# Patient Record
Sex: Female | Born: 1975 | Race: Black or African American | Hispanic: No | Marital: Married | Smoking: Former smoker
Health system: Southern US, Community
[De-identification: ages and names within clinical notes are randomized; demographics above are authoritative.]

## PROBLEM LIST (undated history)

## (undated) DIAGNOSIS — Z789 Other specified health status: Secondary | ICD-10-CM

## (undated) DIAGNOSIS — M549 Dorsalgia, unspecified: Secondary | ICD-10-CM

## (undated) HISTORY — PX: NO PAST SURGERIES: SHX2092

---

## 2001-10-24 ENCOUNTER — Emergency Department (HOSPITAL_COMMUNITY): Admission: EM | Admit: 2001-10-24 | Discharge: 2001-10-24 | Payer: Self-pay

## 2015-07-03 ENCOUNTER — Other Ambulatory Visit: Payer: Self-pay | Admitting: Family Medicine

## 2015-11-17 ENCOUNTER — Ambulatory Visit
Admission: EM | Admit: 2015-11-17 | Discharge: 2015-11-17 | Disposition: A | Discharge status: Home / Self Care | Payer: BLUE CROSS/BLUE SHIELD | Attending: Family Medicine | Admitting: Family Medicine

## 2015-11-17 ENCOUNTER — Encounter: Payer: Self-pay | Admitting: Emergency Medicine

## 2015-11-17 DIAGNOSIS — J0100 Acute maxillary sinusitis, unspecified: Secondary | ICD-10-CM

## 2015-11-17 DIAGNOSIS — R1110 Vomiting, unspecified: Secondary | ICD-10-CM

## 2015-11-17 DIAGNOSIS — R197 Diarrhea, unspecified: Secondary | ICD-10-CM

## 2015-11-17 DIAGNOSIS — J40 Bronchitis, not specified as acute or chronic: Secondary | ICD-10-CM | POA: Diagnosis not present

## 2015-11-17 MED ORDER — BENZONATATE 200 MG PO CAPS: 200.0000 mg | ORAL_CAPSULE | Freq: Three times a day (TID) | ORAL | Status: DC | PRN

## 2015-11-17 MED ORDER — ONDANSETRON 8 MG PO TBDP
8.0000 mg | ORAL_TABLET | Freq: Once | ORAL | Status: AC
  Administered 2015-11-17: 8 mg via ORAL

## 2015-11-17 MED ORDER — AMOXICILLIN-POT CLAVULANATE 875-125 MG PO TABS: 1.0000 | ORAL_TABLET | Freq: Two times a day (BID) | ORAL | Status: DC

## 2015-11-17 MED ORDER — ONDANSETRON 4 MG PO TBDP: 4.0000 mg | ORAL_TABLET | Freq: Three times a day (TID) | ORAL | Status: DC | PRN

## 2015-11-17 NOTE — ED Provider Notes (Signed)
[facility]  ____________________________________________  Time seen: Approximately 5:48 PM  I have reviewed the triage vital signs and the nursing notes.   HISTORY  Chief Complaint Cough and Diarrhea  HPI Julia Savage is a 40 y.o. female presents with spouse at bedside for the complaints of 5-6 days of runny nose, nasal congestion, sinus drainage, cough and chest congestion. Also reports 2 days of intermittent diarrhea. Patient states that she thinks that she has had 6 episodes of diarrhea since yesterday. States diarrhea is normal color. Denies any blood in stool, dark-colored stool, blood in toilet or when wiping. Patient also reports over the last 24 hours she has had 2 or 3 episodes of vomiting. Patient states last episode of vomiting was early this morning. Patient states that she has only vomited after coughing several times back-to-back. Patient denies any vomiting in absence of actively coughing. States mild nausea at this time and can feel drainage in back of throat. Patient also states that she feels like she pulled a muscle in her left side abdomen from coughing. States that this feels like a pulled a muscle. Denies abdominal pain.   Reports multiple sick contacts at work that have had GI symptoms as well as the flu. Patient states that her biggest complaint is the cough and states that the cough is a hacking cough and did wake her up several times last night. Patient reports that she did blow her nose earlier today and saw a mild amount of pink blood that was present with mucous. Denies any other bleeding. Denies any other abnormal bleeding. Denies any blood clots. Denies any nosebleeds in absence of actively blowing her nose. Patient reports that she has that in the past when coughing and blowing her nose a lot. Denies blood in vomit.  Denies fevers. Denies chest pain, shortness of breath, chest pain with deep breath, extremity pain, extremity swelling, rash, neck pain,  back pain, dysuria, vaginal complaints.   Patient's last menstrual period was 11/03/2015 (approximate). Denies chance of pregnancy.   History reviewed. No pertinent past medical history. Chronic low back pain.    There are no active problems to display for this patient.   History reviewed. No pertinent past surgical history.  Current Outpatient Rx  Name  Route  Sig  Dispense  Refill  . gabapentin (NEURONTIN) 600 MG tablet   Oral   Take 600 mg by mouth 3 (three) times daily.           Allergies Hydrocodone; Percocet; and Vicodin  History reviewed. No pertinent family history.  Social History Social History  Substance Use Topics  . Smoking status: Current Every Day Smoker -- 1.00 packs/day    Types: Cigarettes  . Smokeless tobacco: None  . Alcohol Use: Yes    Review of Systems Constitutional: No fever/chills Eyes: No visual changes. ENT: No sore throat. Positive runny nose, nasal congestion, cough, and sinus pressure.  Cardiovascular: Denies chest pain. Respiratory: Denies shortness of breath. Gastrointestinal: No abdominal pain.  No constipation. Genitourinary: Negative for dysuria. Musculoskeletal: Negative for back pain. Skin: Negative for rash. Neurological: Negative for headaches, focal weakness or numbness.  10-point ROS otherwise negative.  ____________________________________________   PHYSICAL EXAM:  VITAL SIGNS: ED Triage Vitals  Enc Vitals Group     BP 11/17/15 1655 119/74 mmHg     Pulse Rate 11/17/15 1655 78     Resp 11/17/15 1655 17     Temp 11/17/15 1655 98.2 F (36.8 C)  Temp Source 11/17/15 1655 Oral     SpO2 11/17/15 1655 100 %     Weight 11/17/15 1655 284 lb (128.822 kg)     Height 11/17/15 1655 5\' 8"  (1.727 m)     Head Cir --      Peak Flow --      Pain Score 11/17/15 1659 4     Pain Loc --      Pain Edu? --      Excl. in GC? --   Constitutional: Alert and oriented. Well appearing and in no acute distress. Eyes:  Conjunctivae are normal. PERRL. EOMI. Head: Atraumatic.Mild tenderness to palpation bilateral frontal and maxillary sinuses. No swelling. No erythema.   Ears: no erythema, dullness bilaterally, otherwise normal TMs bilaterally.   Nose: nasal congestion with bilateral nasal turbinate erythema and edema. No active bleeding or dried blood present.   Mouth/Throat: Mucous membranes are moist.  Oropharynx non-erythematous.No tonsillar swelling or exudate.  Neck: No stridor.  No cervical spine tenderness to palpation. Hematological/Lymphatic/Immunilogical: No cervical lymphadenopathy. Cardiovascular: Normal rate, regular rhythm. Grossly normal heart sounds.  Good peripheral circulation. Chest nontender to palpation.   Respiratory: Normal respiratory effort.  No retractions. Lungs CTAB. No wheezes, rales or rhonchi. Good air movement.  Gastrointestinal: Soft and nontender. Obese abdomen. Normal Bowel sounds. No CVA tenderness. Musculoskeletal: No lower or upper extremity tenderness nor edema.  Bilateral pedal pulses equal and easily palpated. No cervical, thoracic or lumbar tenderness to palpation. No calf tenderness bilaterally.  Neurologic:  Normal speech and language. No gross focal neurologic deficits are appreciated. No gait instability. Skin:  Skin is warm, dry and intact. No rash noted. Psychiatric: Mood and affect are normal. Speech and behavior are normal.  ____________________________________________   LABS (all labs ordered are listed, but only abnormal results are displayed)  Labs Reviewed - No data to display   INITIAL IMPRESSION / ASSESSMENT AND PLAN / ED COURSE  Pertinent labs & imaging results that were available during my care of the patient were reviewed by me and considered in my medical decision making (see chart for details).  Well appearing patient. No acute distress.  Presents for the complaints of 5-6 days of runny nose, nasal congestion and cough with chest congestion.  Spouse states cough keeps her up at night.  Patient reports the last 2 days intermittent diarrhea as well as some vomiting. Patient reports that vomiting is only present when coughing back-to-back several times causing her to gag and then vomited. Patient states that she feels like she is pulled muscle in her left abdomen. Abdomen soft and nontender to palpation. Lungs clear throughout. Patient denies abdominal pain at this time.  reports multiple sick contacts at work including influenza as well as GI symptoms. Suspect sinusitis and bronchitis with gastroenteritis suspect viral. Will treat patient with oral Augmentin, and when necessary Tessalon Perles, and when necessary Zofran.Encouraged supportive treatment including rest, fluids, over-the-counter Tylenol or ibuprofen as needed. BRAT diet. Encouraged close the follow-up. Discussed indication, risks and benefits of medications with patient.  Discussed follow up with Primary care physician this week. Discussed follow up and return parameters to Urgent care or ER including fevers, abdominal pain, abnormal bleeding, weakness, chest pain, shortness of breath, inability to tolerate food or fluids, no resolution or any worsening concerns. Patient verbalized understanding and agreed to plan.   ____________________________________________   FINAL CLINICAL IMPRESSION(S) / ED DIAGNOSES  Final diagnoses:  Acute maxillary sinusitis, recurrence not specified  Bronchitis  Vomiting and diarrhea  Note: This dictation was prepared with Dragon dictation along with smaller phrase technology. Any transcriptional errors that result from this process are unintentional.    Renford Dills, NP 11/17/15 1858

## 2015-11-17 NOTE — ED Notes (Signed)
Patient c/o cough, chest congestion, runny nose, for a week.  Patient denies fevers.

## 2015-11-17 NOTE — Discharge Instructions (Signed)
Take medication as prescribed. Rest. Drink plenty of fluids. Take over the counter tylenol or ibuprofen as needed.   Follow up with your primary care physician this week.  Return to Urgent care or ER for abdominal pain, inability to tolerate food or fluids, urinary changes, fevers, blood in toilet or stool, blood in vomiting or sputum, new or worsening concerns.    Nausea and Vomiting Nausea means you feel sick to your stomach. Throwing up (vomiting) is a reflex where stomach contents come out of your mouth. HOME CARE   Take medicine as told by your doctor.  Do not force yourself to eat. However, you do need to drink fluids.  If you feel like eating, eat a normal diet as told by your doctor.  Eat rice, wheat, potatoes, bread, lean meats, yogurt, fruits, and vegetables.  Avoid high-fat foods.  Drink enough fluids to keep your pee (urine) clear or pale yellow.  Ask your doctor how to replace body fluid losses (rehydrate). Signs of body fluid loss (dehydration) include:  Feeling very thirsty.  Dry lips and mouth.  Feeling dizzy.  Dark pee.  Peeing less than normal.  Feeling confused.  Fast breathing or heart rate. GET HELP RIGHT AWAY IF:   You have blood in your throw up.  You have black or bloody poop (stool).  You have a bad headache or stiff neck.  You feel confused.  You have bad belly (abdominal) pain.  You have chest pain or trouble breathing.  You do not pee at least once every 8 hours.  You have cold, clammy skin.  You keep throwing up after 24 to 48 hours.  You have a fever. MAKE SURE YOU:   Understand these instructions.  Will watch your condition.  Will get help right away if you are not doing well or get worse.   This information is not intended to replace advice given to you by your health care provider. Make sure you discuss any questions you have with your health care provider.   Document Released: 02/01/2008 Document Revised:  11/07/2011 Document Reviewed: 01/14/2011 Elsevier Interactive Patient Education 2016 Else[employer] Inc[employer]  Diarrhea Diarrhea is watery poop (stool). It can make you feel weak, tired, thirsty, or give you a dry mouth (signs of dehydration). Watery poop is a sign of another problem, most often an infection. It often lasts 2-3 days. It can last longer if it is a sign of something serious. Take care of yourself as told by your doctor. HOME CARE   Drink 1 cup (8 ounces) of fluid each time you have watery poop.  Do not drink the following fluids:  Those that contain simple sugars (fructose, glucose, galactose, lactose, sucrose, maltose).  Sports drinks.  Fruit juices.  Whole milk products.  Sodas.  Drinks with caffeine (coffee, tea, soda) or alcohol.  Oral rehydration solution may be used if the doctor says it is okay. You may make your own solution. Follow this recipe:   - teaspoon table salt.   teaspoon baking soda.   teaspoon salt substitute containing potassium chloride.  1 tablespoons sugar.  1 liter (34 ounces) of water.  Avoid the following foods:  High fiber foods, such as raw fruits and vegetables.  Nuts, seeds, and whole grain breads and cereals.   Those that are sweetened with sugar alcohols (xylitol, sorbitol, mannitol).  Try eating the following foods:  Starchy foods, such as rice, toast, pasta, low-sugar cereal, oatmeal, baked potatoes, crackers, and bagels.  Bananas.  Applesauce.  Eat probiotic-rich foods, such as yogurt and milk products that are fermented.  Wash your hands well after each time you have watery poop.  Only take medicine as told by your doctor.  Take a warm bath to help lessen burning or pain from having watery poop. GET HELP RIGHT AWAY IF:   You cannot drink fluids without throwing up (vomiting).  You keep throwing up.  You have blood in your poop, or your poop looks black and tarry.  You do not pee (urinate) in 6-8 hours, or  there is only a small amount of very dark pee.  You have belly (abdominal) pain that gets worse or stays in the same spot (localizes).  You are weak, dizzy, confused, or light-headed.  You have a very bad headache.  Your watery poop gets worse or does not get better.  You have a fever or lasting symptoms for more than 2-3 days.  You have a fever and your symptoms suddenly get worse. MAKE SURE YOU:   Understand these instructions.  Will watch your condition.  Will get help right away if you are not doing well or get worse.   This information is not intended to replace advice given to you by your health care provider. Make sure you discuss any questions you have with your health care provider.   Document Released: 02/01/2008 Document Revised: 09/05/2014 Document Reviewed: 04/22/2012 Elsevier Interactive Patient Education 2016 Elsevier Inc.  Sinusitis, Adult Sinusitis is redness, soreness, and inflammation of the paranasal sinuses. Paranasal sinuses are air pockets within the bones of your face. They are located beneath your eyes, in the middle of your forehead, and above your eyes. In healthy paranasal sinuses, mucus is able to drain out, and air is able to circulate through them by way of your nose. However, when your paranasal sinuses are inflamed, mucus and air can become trapped. This can allow bacteria and other germs to grow and cause infection. Sinusitis can develop quickly and last only a short time (acute) or continue over a long period (chronic). Sinusitis that lasts for more than 12 weeks is considered chronic. CAUSES Causes of sinusitis include:  Allergies.  Structural abnormalities, such as displacement of the cartilage that separates your nostrils (deviated septum), which can decrease the air flow through your nose and sinuses and affect sinus drainage.  Functional abnormalities, such as when the small hairs (cilia) that line your sinuses and help remove mucus do not  work properly or are not present. SIGNS AND SYMPTOMS Symptoms of acute and chronic sinusitis are the same. The primary symptoms are pain and pressure around the affected sinuses. Other symptoms include:  Upper toothache.  Earache.  Headache.  Bad breath.  Decreased sense of smell and taste.  A cough, which worsens when you are lying flat.  Fatigue.  Fever.  Thick drainage from your nose, which often is [place] and may contain pus (purulent).  Swelling and warmth over the affected sinuses. DIAGNOSIS Your health care provider will perform a physical exam. During your exam, your health care provider may perform any of the following to help determine if you have acute sinusitis or chronic sinusitis:  Look in your nose for signs of abnormal growths in your nostrils (nasal polyps).  Tap over the affected sinus to check for signs of infection.  View the inside of your sinuses using an imaging device that has a light attached (endoscope). If your health care provider suspects that you have chronic sinusitis, one or more of  the following tests may be recommended:  Allergy tests.  Nasal culture. A sample of mucus is taken from your nose, sent to a lab, and screened for bacteria.  Nasal cytology. A sample of mucus is taken from your nose and examined by your health care provider to determine if your sinusitis is related to an allergy. TREATMENT Most cases of acute sinusitis are related to a viral infection and will resolve on their own within 10 days. Sometimes, medicines are prescribed to help relieve symptoms of both acute and chronic sinusitis. These may include pain medicines, decongestants, nasal steroid sprays, or saline sprays. However, for sinusitis related to a bacterial infection, your health care provider will prescribe antibiotic medicines. These are medicines that will help kill the bacteria causing the infection. Rarely, sinusitis is caused by a fungal infection. In these  cases, your health care provider will prescribe antifungal medicine. For some cases of chronic sinusitis, surgery is needed. Generally, these are cases in which sinusitis recurs more than 3 times per year, despite other treatments. HOME CARE INSTRUCTIONS  Drink plenty of water. Water helps thin the mucus so your sinuses can drain more easily.  Use a humidifier.  Inhale steam 3-4 times a day (for example, sit in the bathroom with the shower running).  Apply a warm, moist washcloth to your face 3-4 times a day, or as directed by your health care provider.  Use saline nasal sprays to help moisten and clean your sinuses.  Take medicines only as directed by your health care provider.  If you were prescribed either an antibiotic or antifungal medicine, finish it all even if you start to feel better. SEEK IMMEDIATE MEDICAL CARE IF:  You have increasing pain or severe headaches.  You have nausea, vomiting, or drowsiness.  You have swelling around your face.  You have vision problems.  You have a stiff neck.  You have difficulty breathing.   This information is not intended to replace advice given to you by your health care provider. Make sure you discuss any questions you have with your health care provider.   Document Released: 08/15/2005 Document Revised: 09/05/2014 Document Reviewed: 08/30/2011 Elsevier Interactive Patient Education [employer]

## 2017-02-04 ENCOUNTER — Ambulatory Visit (INDEPENDENT_AMBULATORY_CARE_PROVIDER_SITE_OTHER): Payer: BLUE CROSS/BLUE SHIELD

## 2017-02-04 ENCOUNTER — Ambulatory Visit
Admission: EM | Admit: 2017-02-04 | Discharge: 2017-02-04 | Disposition: A | Discharge status: Home / Self Care | Payer: BLUE CROSS/BLUE SHIELD | Attending: Emergency Medicine | Admitting: Emergency Medicine

## 2017-02-04 DIAGNOSIS — J020 Streptococcal pharyngitis: Secondary | ICD-10-CM | POA: Diagnosis not present

## 2017-02-04 DIAGNOSIS — J069 Acute upper respiratory infection, unspecified: Secondary | ICD-10-CM

## 2017-02-04 LAB — RAPID STREP SCREEN (MED CTR MEBANE ONLY): STREPTOCOCCUS, GROUP A SCREEN (DIRECT): POSITIVE — AB

## 2017-02-04 MED ORDER — BENZONATATE 200 MG PO CAPS: ORAL_CAPSULE | ORAL | 0 refills | Status: DC

## 2017-02-04 MED ORDER — ALBUTEROL SULFATE HFA 108 (90 BASE) MCG/ACT IN AERS: 1.0000 | INHALATION_SPRAY | Freq: Four times a day (QID) | RESPIRATORY_TRACT | 0 refills | Status: DC | PRN

## 2017-02-04 MED ORDER — PENICILLIN G BENZATHINE 1200000 UNIT/2ML IM SUSP
1.2000 10*6.[IU] | Freq: Once | INTRAMUSCULAR | Status: AC
  Administered 2017-02-04: 1.2 10*6.[IU] via INTRAMUSCULAR

## 2017-02-04 MED ORDER — AEROCHAMBER PLUS FLO-VU MEDIUM MISC
1.0000 | Freq: Once | Status: AC
  Administered 2017-02-04: 1

## 2017-02-04 NOTE — ED Triage Notes (Signed)
Pt c/o cough, some bloody show with mucus, and sore throat from drainage for about a week.

## 2017-02-04 NOTE — ED Provider Notes (Signed)
CSN: [id number]     Arrival date & time 02/04/17  1321 History   First MD Initiated Contact with Patient 02/04/17 1440     Chief Complaint  Patient presents with  . Cough  . Sore Throat   (Consider location/radiation/quality/duration/timing/severity/associated sxs/prior Treatment) HPI  This a 41 year old female who presents with a cough that she's had for about a week productive of sputum with this several blood specks recently which prompted her visit today. She's also had a sore throat which she thinks is from the drainage and from coughing so much. Patient is a smoker up to 1 pack of cigarettes per day. She has not had fever or chills. He said the cough is loose and causes rattling sensation in her chest.        History reviewed. No pertinent past medical history. History reviewed. No pertinent surgical history. History reviewed. No pertinent family history. Social History  Substance Use Topics  . Smoking status: Current Every Day Smoker    Packs/day: 1.00    Types: Cigarettes  . Smokeless tobacco: Never Used  . Alcohol use Yes   OB History    No data available     Review of Systems  Constitutional: Positive for activity change. Negative for appetite change, chills, fatigue and fever.  HENT: Positive for congestion.   Respiratory: Positive for cough and wheezing. Negative for stridor.   All other systems reviewed and are negative.   Allergies  Hydrocodone; Percocet [oxycodone-acetaminophen]; and Vicodin [hydrocodone-acetaminophen]  Home Medications   Prior to Admission medications   Medication Sig Start Date End Date Taking? Authorizing Provider  dextromethorphan-guaiFENesin (MUCINEX DM) 30-600 MG 12hr tablet Take 1 tablet by mouth 2 (two) times daily.   Yes [provider]  albuterol (PROVENTIL HFA;VENTOLIN HFA) 108 (90 Base) MCG/ACT inhaler Inhale 1-2 puffs into the lungs every 6 (six) hours as needed for wheezing or shortness of breath. 02/04/17   Lutricia Feil, PA-C  benzonatate (TESSALON) 200 MG capsule Take one cap TID PRN cough 02/04/17   Lutricia Feil, PA-C   Meds Ordered and Administered this Visit   Medications  AEROCHAMBER PLUS FLO-VU MEDIUM MISC 1 each (not administered)  penicillin g benzathine (BICILLIN LA) 1200000 UNIT/2ML injection 1.2 Million Units (1.2 Million Units Intramuscular Given 02/04/17 1540)    BP (!) 144/91 (BP Location: Left Arm)   Pulse 93   Temp 98.7 F (37.1 C) (Oral)   Resp 18   Ht 5\' 8"  (1.727 m)   Wt (!) 317 lb (143.8 kg)   LMP 01/27/2017   SpO2 99%   BMI 48.20 kg/m  No data found.   Physical Exam  Constitutional: She is oriented to person, place, and time. She appears well-developed and well-nourished. No distress.  HENT:  Head: Normocephalic.  Right Ear: External ear normal.  Left Ear: External ear normal.  Nose: Nose normal.  Mouth/Throat: Oropharynx is clear and moist. No oropharyngeal exudate.  Eyes: Pupils are equal, round, and reactive to light. Right eye exhibits no discharge. Left eye exhibits no discharge.  Neck: Normal range of motion. Neck supple.  Pulmonary/Chest: Effort normal.  Patient has loud inspiratory and expiratory rhonchi particularly on the right.  Musculoskeletal: Normal range of motion.  Lymphadenopathy:    She has no cervical adenopathy.  Neurological: She is alert and oriented to person, place, and time.  Skin: Skin is warm and dry. She is not diaphoretic.  Psychiatric: She has a normal mood and affect. Her behavior is normal.  Judgment and thought content normal.  Nursing note and vitals reviewed.   Urgent Care Course     Procedures (including critical care time)  Labs Review Labs Reviewed  RAPID STREP SCREEN (NOT AT [facility]) - Abnormal; Notable for the following:       Result Value   Streptococcus, Group A Screen (Direct) POSITIVE (*)    All other components within normal limits    Imaging Review Dg Chest 2 View  Result Date: 02/04/2017 CLINICAL  DATA:  Cough for 1 week EXAM: CHEST  2 VIEW COMPARISON:  None. FINDINGS: Normal heart size. Lungs clear. No pneumothorax. No pleural effusion. IMPRESSION: No active cardiopulmonary disease. Electronically Signed   By: Jolaine ClickArthur  Hoss M.D.   On: 02/04/2017 15:01     Visual Acuity Review  Right Eye Distance:   Left Eye Distance:   Bilateral Distance:    Right Eye Near:   Left Eye Near:    Bilateral Near:     Medications  AEROCHAMBER PLUS FLO-VU MEDIUM MISC 1 each (not administered)  penicillin g benzathine (BICILLIN LA) 1200000 UNIT/2ML injection 1.2 Million Units (1.2 Million Units Intramuscular Given 02/04/17 1540)     MDM   1. Strep pharyngitis   2. Upper respiratory tract infection, unspecified type    Plan: 1. Test/x-ray results and diagnosis reviewed with patient 2. rx as per orders; risks, benefits, potential side effects reviewed with patient 3. Recommend supportive treatment with fluids and rest. Continue using Mucinex for cough and had had Delsym or Robitussin cough syrup at nighttime. Patient is allergic to codeine and unable to take Tussionex. Use a Tessalon Perles 200 daytime. Use a spacer with your albuterol as necessary. Do not improve he should follow-up with her primary care physician or return to our clinic. 4. F/u prn if symptoms worsen or don't improve      Lutricia FeilRoemer, William P, PA-C 02/04/17 1549

## 2018-06-01 ENCOUNTER — Other Ambulatory Visit: Payer: Self-pay

## 2018-07-24 ENCOUNTER — Emergency Department
Admission: EM | Admit: 2018-07-24 | Discharge: 2018-07-24 | Disposition: A | Discharge status: Home / Self Care | Payer: BLUE CROSS/BLUE SHIELD | Attending: Emergency Medicine | Admitting: Emergency Medicine

## 2018-07-24 ENCOUNTER — Emergency Department: Payer: BLUE CROSS/BLUE SHIELD

## 2018-07-24 ENCOUNTER — Other Ambulatory Visit: Payer: Self-pay

## 2018-07-24 DIAGNOSIS — F17210 Nicotine dependence, cigarettes, uncomplicated: Secondary | ICD-10-CM | POA: Diagnosis not present

## 2018-07-24 DIAGNOSIS — M545 Low back pain: Secondary | ICD-10-CM | POA: Diagnosis present

## 2018-07-24 DIAGNOSIS — M5440 Lumbago with sciatica, unspecified side: Secondary | ICD-10-CM | POA: Diagnosis not present

## 2018-07-24 HISTORY — DX: Dorsalgia, unspecified: M54.9

## 2018-07-24 MED ORDER — KETOROLAC TROMETHAMINE 30 MG/ML IJ SOLN
30.0000 mg | Freq: Once | INTRAMUSCULAR | Status: AC
  Administered 2018-07-24: 30 mg via INTRAVENOUS
  Filled 2018-07-24: qty 1

## 2018-07-24 MED ORDER — LORAZEPAM 2 MG/ML IJ SOLN
1.0000 mg | Freq: Once | INTRAMUSCULAR | Status: AC
  Administered 2018-07-24: 1 mg via INTRAVENOUS
  Filled 2018-07-24: qty 1

## 2018-07-24 MED ORDER — DEXAMETHASONE SODIUM PHOSPHATE 10 MG/ML IJ SOLN
10.0000 mg | Freq: Once | INTRAMUSCULAR | Status: AC
  Administered 2018-07-24: 10 mg via INTRAVENOUS
  Filled 2018-07-24: qty 1

## 2018-07-24 MED ORDER — HYDROMORPHONE HCL 1 MG/ML IJ SOLN
0.5000 mg | Freq: Once | INTRAMUSCULAR | Status: AC
  Administered 2018-07-24: 0.5 mg via INTRAVENOUS
  Filled 2018-07-24: qty 1

## 2018-07-24 MED ORDER — PREDNISONE 10 MG (21) PO TBPK: ORAL_TABLET | ORAL | 0 refills | Status: DC

## 2018-07-24 MED ORDER — DIAZEPAM 5 MG PO TABS: 5.0000 mg | ORAL_TABLET | Freq: Three times a day (TID) | ORAL | 0 refills | Status: DC | PRN

## 2018-07-24 NOTE — ED Notes (Signed)
10/10 lower back pain, iv meds given patient off unit to xray.

## 2018-07-24 NOTE — ED Notes (Signed)
Pt called for ride at this time.

## 2018-07-24 NOTE — ED Notes (Signed)
Pt c/o BLE numbness and tingling, worse on left. Able to bear weight, wiggle toes. +2 pulses.

## 2018-07-24 NOTE — ED Provider Notes (Signed)
[facility] Emergency Department Provider Note       Time seen: ----------------------------------------- 1:46 PM on 07/24/2018 -----------------------------------------   I have reviewed the triage vital signs and the nursing notes.  HISTORY   Chief Complaint Back Pain    HPI Julia Savage is a 42 y.o. female with a history of chronic back pain who presents to the ED for back pain.  Patient describes bilateral lower extremity numbness and tingling, worse on the left.  Patient states she is able to bear weight.  She arrives with back pain by EMS, states she got up this morning sneezed which caused the pain to increase.  She is been unable to ambulate since then.  She received injections in her back in one point in the distant past.  She denies fevers, chills or other complaints.  Patient states the pain radiates down both legs  Past Medical History:  Diagnosis Date  . Back pain     There are no active problems to display for this patient.   History reviewed. No pertinent surgical history.  Allergies Hydrocodone; Percocet [oxycodone-acetaminophen]; and Vicodin [hydrocodone-acetaminophen]  Social History Social History   Tobacco Use  . Smoking status: Current Every Day Smoker    Packs/day: 1.00    Types: Cigarettes  . Smokeless tobacco: Never Used  Substance Use Topics  . Alcohol use: Yes  . Drug use: Not on file   Review of Systems Constitutional: Negative for fever. Cardiovascular: Negative for chest pain. Respiratory: Negative for shortness of breath. Gastrointestinal: Negative for abdominal pain, vomiting and diarrhea. Musculoskeletal: Positive for back pain Skin: Negative for rash. Neurological: Negative for headaches, focal weakness or numbness.  All systems negative/normal/unremarkable except as stated in the HPI  ____________________________________________   PHYSICAL EXAM:  VITAL SIGNS: ED Triage Vitals  Enc Vitals Group      BP 07/24/18 1325 134/80     Pulse Rate 07/24/18 1323 78     Resp 07/24/18 1323 20     Temp 07/24/18 1323 98.1 F (36.7 C)     Temp Source 07/24/18 1323 Oral     SpO2 07/24/18 1323 100 %     Weight 07/24/18 1324 (!) 314 lb (142.4 kg)     Height 07/24/18 1324 5\' 6"  (1.676 m)     Head Circumference --      Peak Flow --      Pain Score 07/24/18 1324 10     Pain Loc --      Pain Edu? --      Excl. in GC? --    Constitutional: Alert and oriented. Well appearing and in no distress. Cardiovascular: Normal rate, regular rhythm. No murmurs, rubs, or gallops. Respiratory: Normal respiratory effort without tachypnea nor retractions. Breath sounds are clear and equal bilaterally. No wheezes/rales/rhonchi. Gastrointestinal: Soft and nontender. Normal bowel sounds Musculoskeletal:  Severe pain with any movement of her lower extremities.  Lower extremities appear to be neurovascularly intact.  Tenderness nonspecifically in the lumbar spine region. Neurologic:  Normal speech and language. No gross focal neurologic deficits are appreciated.  Skin:  Skin is warm, dry and intact. No rash noted. Psychiatric: Mood and affect are normal. Speech and behavior are normal.  ____________________________________________  ED COURSE:  As part of my medical decision making, I reviewed the following data within the electronic MEDICAL RECORD NUMBER History obtained from family if available, nursing notes, old chart and ekg, as well as notes from prior ED visits. Patient presented for low back  pain, we will assess with labs and imaging as indicated at this time.   Procedures ____________________________________________   RADIOLOGY Images were viewed by me  Lumbar spine x-rays IMPRESSION: No acute abnormality identified correspond with patient's given clinical history. ____________________________________________  DIFFERENTIAL DIAGNOSIS   Muscle strain, spasm, chronic pain, herniated disc  FINAL  ASSESSMENT AND PLAN  Low back pain   Plan: The patient had presented for sudden onset low back pain after sneezing. Patient's imaging did not reveal any acute process.  Pain seems to be out of proportion to examination and radiographs.  She was able to ambulate here.  She will be discharged with Valium and prednisone.   Ulice DashJohnathan E Alegandro Macnaughton, MD   Note: This note was generated in part or whole with voice recognition software. Voice recognition is usually quite accurate but there are transcription errors that can and very often do occur. I apologize for any typographical errors that were not detected and corrected.     Emily FilbertWilliams, Providencia Hottenstein E, MD 07/24/18 1450

## 2018-07-24 NOTE — ED Notes (Signed)
Pt taken to car via wheelchair. Pt is able to bear weight and get in and out of wheelchair. VSS. NAD. Discharge instructions, RX and follow up reviewed. All questions and concerns addressed.

## 2018-07-24 NOTE — ED Notes (Signed)
Patient reports no relief of pain requesting a perscription for something to help her get into a sitting position.

## 2018-07-24 NOTE — ED Triage Notes (Signed)
Pt arrives to ED c/o back pain via [facility]. Sneezed which caused pain to increase. States unable to ambulate since sneezing. Hx chronic back pain. States worse over past month. States can't feel L leg.   Saw specialist 2 years ago and received a steroid shot. Hasn't been back since.   A&O, wants to lie flat.

## 2018-09-05 ENCOUNTER — Other Ambulatory Visit: Payer: Self-pay | Admitting: Orthopedic Surgery

## 2018-09-05 DIAGNOSIS — M5442 Lumbago with sciatica, left side: Principal | ICD-10-CM

## 2018-09-05 DIAGNOSIS — G8929 Other chronic pain: Secondary | ICD-10-CM

## 2018-09-05 DIAGNOSIS — M5441 Lumbago with sciatica, right side: Principal | ICD-10-CM

## 2018-09-19 ENCOUNTER — Encounter (INDEPENDENT_AMBULATORY_CARE_PROVIDER_SITE_OTHER): Payer: Self-pay

## 2018-09-19 ENCOUNTER — Ambulatory Visit
Admission: RE | Admit: 2018-09-19 | Discharge: 2018-09-19 | Disposition: A | Discharge status: Home / Self Care | Payer: BLUE CROSS/BLUE SHIELD | Source: Ambulatory Visit | Attending: Orthopedic Surgery | Admitting: Orthopedic Surgery

## 2018-09-19 DIAGNOSIS — M5441 Lumbago with sciatica, right side: Secondary | ICD-10-CM | POA: Diagnosis present

## 2018-09-19 DIAGNOSIS — G8929 Other chronic pain: Secondary | ICD-10-CM | POA: Diagnosis present

## 2018-09-19 DIAGNOSIS — M5442 Lumbago with sciatica, left side: Secondary | ICD-10-CM | POA: Diagnosis present

## 2018-10-08 ENCOUNTER — Encounter
Admission: RE | Admit: 2018-10-08 | Discharge: 2018-10-08 | Disposition: A | Discharge status: Home / Self Care | Payer: BLUE CROSS/BLUE SHIELD | Source: Ambulatory Visit | Attending: Neurosurgery | Admitting: Neurosurgery

## 2018-10-08 ENCOUNTER — Other Ambulatory Visit: Payer: Self-pay

## 2018-10-08 ENCOUNTER — Ambulatory Visit
Admission: RE | Admit: 2018-10-08 | Discharge: 2018-10-08 | Disposition: A | Discharge status: Home / Self Care | Payer: BLUE CROSS/BLUE SHIELD | Source: Ambulatory Visit | Attending: Neurosurgery | Admitting: Neurosurgery

## 2018-10-08 DIAGNOSIS — Z01818 Encounter for other preprocedural examination: Secondary | ICD-10-CM | POA: Insufficient documentation

## 2018-10-08 HISTORY — DX: Other specified health status: Z78.9

## 2018-10-08 LAB — CBC
HEMATOCRIT: 43.7 % (ref 36.0–46.0)
Hemoglobin: 13.6 g/dL (ref 12.0–15.0)
MCH: 28.2 pg (ref 26.0–34.0)
MCHC: 31.1 g/dL (ref 30.0–36.0)
MCV: 90.5 fL (ref 80.0–100.0)
NRBC: 0 % (ref 0.0–0.2)
Platelets: 237 10*3/uL (ref 150–400)
RBC: 4.83 MIL/uL (ref 3.87–5.11)
RDW: 14.6 % (ref 11.5–15.5)
WBC: 8.7 10*3/uL (ref 4.0–10.5)

## 2018-10-08 LAB — TYPE AND SCREEN
ABO/RH(D): O POS
Antibody Screen: NEGATIVE

## 2018-10-08 LAB — PROTIME-INR
INR: 0.91
PROTHROMBIN TIME: 12.2 s (ref 11.4–15.2)

## 2018-10-08 LAB — DIFFERENTIAL
Abs Immature Granulocytes: 0.03 10*3/uL (ref 0.00–0.07)
BASOS ABS: 0 10*3/uL (ref 0.0–0.1)
Basophils Relative: 0 %
EOS ABS: 0.5 10*3/uL (ref 0.0–0.5)
EOS PCT: 6 %
IMMATURE GRANULOCYTES: 0 %
LYMPHS PCT: 41 %
Lymphs Abs: 3.6 10*3/uL (ref 0.7–4.0)
Monocytes Absolute: 0.7 10*3/uL (ref 0.1–1.0)
Monocytes Relative: 8 %
Neutro Abs: 3.9 10*3/uL (ref 1.7–7.7)
Neutrophils Relative %: 45 %

## 2018-10-08 LAB — URINALYSIS, ROUTINE W REFLEX MICROSCOPIC
BILIRUBIN URINE: NEGATIVE
GLUCOSE, UA: NEGATIVE mg/dL
HGB URINE DIPSTICK: NEGATIVE
KETONES UR: NEGATIVE mg/dL
Leukocytes, UA: NEGATIVE
Nitrite: NEGATIVE
PH: 7 (ref 5.0–8.0)
Protein, ur: NEGATIVE mg/dL
Specific Gravity, Urine: 1.024 (ref 1.005–1.030)

## 2018-10-08 LAB — APTT: aPTT: 27 seconds (ref 24–36)

## 2018-10-08 LAB — BASIC METABOLIC PANEL
Anion gap: 7 (ref 5–15)
BUN: 15 mg/dL (ref 6–20)
CALCIUM: 9.7 mg/dL (ref 8.9–10.3)
CHLORIDE: 107 mmol/L (ref 98–111)
CO2: 24 mmol/L (ref 22–32)
CREATININE: 0.76 mg/dL (ref 0.44–1.00)
Glucose, Bld: 83 mg/dL (ref 70–99)
Potassium: 4.4 mmol/L (ref 3.5–5.1)
SODIUM: 138 mmol/L (ref 135–145)

## 2018-10-08 NOTE — Patient Instructions (Signed)
Your procedure is scheduled on: Monday 10/15/2018 Report to DAY SURGERY DEPARTMENT LOCATED ON 2ND FLOOR MEDICAL MALL ENTRANCE. To find out your arrival time please call [phone] between 1PM - 3PM on Firday 10/12/2018.  Remember: Instructions that are not followed completely may result in serious medical risk, up to and including death, or upon the discretion of your surgeon and anesthesiologist your surgery may need to be rescheduled.     _X__ 1. Do not eat food after midnight the night before your procedure.                 No gum chewing or hard candies. You may drink clear liquids up to 2 hours                 before you are scheduled to arrive for your surgery- DO not drink clear                 liquids within 2 hours of the start of your surgery.                 Clear Liquids include:  water, apple juice without pulp, clear carbohydrate                 drink such as Clearfast or Gatorade, Black Coffee or Tea (Do not add                 anything to coffee or tea).  __X__2.  On the morning of surgery brush your teeth with toothpaste and water, you                 may rinse your mouth with mouthwash if you wish.  Do not swallow any              toothpaste of mouthwash.     _X__ 3.  No Alcohol for 24 hours before or after surgery.   _X__ 4.  Do Not Smoke or use e-cigarettes For 24 Hours Prior to Your Surgery.                 Do not use any chewable tobacco products for at least 6 hours prior to                 surgery.  ____  5.  Bring all medications with you on the day of surgery if instructed.   __X__  6.  Notify your doctor if there is any change in your medical condition      (cold, fever, infections).     Do not wear jewelry, make-up, hairpins, clips or nail polish. Do not wear lotions, powders, or perfumes.  Do not shave 48 hours prior to surgery. Men may shave face and neck. Do not bring valuables to the hospital.    [facility] is not responsible for any belongings or  valuables.  Contacts, dentures/partials or body piercings may not be worn into surgery. Bring a case for your contacts, glasses or hearing aids, a denture cup will be supplied. Leave your suitcase in the car. After surgery it may be brought to your room. For patients admitted to the hospital, discharge time is determined by your treatment team.   Patients discharged the day of surgery will not be allowed to drive home.   Please read over the following fact sheets that you were given:   MRSA Information  __X__ Take these medicines the morning of surgery with A SIP OF WATER:  1. gabapentin (NEURONTIN)   2.   3.   4.  5.  6.  ____ Fleet Enema (as directed)   __X__ Use CHG Soap/SAGE wipes as directed  ____ Use inhalers on the day of surgery  ____ Stop metformin/Janumet/Farxiga 2 days prior to surgery    ____ Take 1/2 of usual insulin dose the night before surgery. No insulin the morning          of surgery.   ____ Stop Blood Thinners Coumadin/Plavix/Xarelto/Pleta/Pradaxa/Eliquis/Effient/Aspirin  on   Or contact your Surgeon, Cardiologist or Medical Doctor regarding  ability to stop your blood thinners  __X__ Stop Anti-inflammatories 7 days before surgery such as Advil, Ibuprofen, Motrin,  BC or Goodies Powder, Naprosyn, Naproxen, Aleve, Aspirin    __X__ Stop all herbal supplements, fish oil or vitamin E until after surgery.    ____ Bring C-Pap to the hospital.

## 2018-10-09 NOTE — Pre-Procedure Instructions (Signed)
SPOKE TO NURSE AT DR COOK'S. MAY BE ABLE TO GET PATIENT IN WITH CARDIOLOGY FASTER THAN PCP SINCE PATIENT DOES NOT HAVE PCP.

## 2018-10-09 NOTE — Pre-Procedure Instructions (Signed)
REQUEST TO HAVE PCP ADDRESS EKG/ PRECORDIAL T WAVES FAXED TO DR Adriana Simas. LM FOR KENDALYN JEAN

## 2018-10-11 NOTE — Pre-Procedure Instructions (Signed)
CLEARED LOW RISK BY CARDIOLOGY. STRESS 10/10/18

## 2018-10-15 ENCOUNTER — Ambulatory Visit: Payer: BLUE CROSS/BLUE SHIELD

## 2018-10-15 ENCOUNTER — Observation Stay
Admission: RE | Admit: 2018-10-15 | Discharge: 2018-10-16 | Disposition: A | Discharge status: Home / Self Care | Payer: BLUE CROSS/BLUE SHIELD | Source: Ambulatory Visit | Attending: Neurosurgery | Admitting: Neurosurgery

## 2018-10-15 ENCOUNTER — Ambulatory Visit: Payer: BLUE CROSS/BLUE SHIELD | Admitting: Anesthesiology

## 2018-10-15 ENCOUNTER — Encounter: Payer: Self-pay | Admitting: *Deleted

## 2018-10-15 ENCOUNTER — Encounter
Admission: RE | Disposition: A | Discharge status: Home / Self Care | Payer: Self-pay | Source: Ambulatory Visit | Attending: Neurosurgery

## 2018-10-15 ENCOUNTER — Other Ambulatory Visit: Payer: Self-pay

## 2018-10-15 DIAGNOSIS — Z885 Allergy status to narcotic agent status: Secondary | ICD-10-CM | POA: Insufficient documentation

## 2018-10-15 DIAGNOSIS — M5416 Radiculopathy, lumbar region: Secondary | ICD-10-CM | POA: Diagnosis present

## 2018-10-15 DIAGNOSIS — N319 Neuromuscular dysfunction of bladder, unspecified: Secondary | ICD-10-CM | POA: Diagnosis not present

## 2018-10-15 DIAGNOSIS — Z6841 Body Mass Index (BMI) 40.0 and over, adult: Secondary | ICD-10-CM | POA: Insufficient documentation

## 2018-10-15 DIAGNOSIS — Z419 Encounter for procedure for purposes other than remedying health state, unspecified: Secondary | ICD-10-CM

## 2018-10-15 DIAGNOSIS — F17210 Nicotine dependence, cigarettes, uncomplicated: Secondary | ICD-10-CM | POA: Insufficient documentation

## 2018-10-15 DIAGNOSIS — M5116 Intervertebral disc disorders with radiculopathy, lumbar region: Secondary | ICD-10-CM | POA: Insufficient documentation

## 2018-10-15 DIAGNOSIS — Z79899 Other long term (current) drug therapy: Secondary | ICD-10-CM | POA: Insufficient documentation

## 2018-10-15 DIAGNOSIS — M48061 Spinal stenosis, lumbar region without neurogenic claudication: Principal | ICD-10-CM | POA: Insufficient documentation

## 2018-10-15 DIAGNOSIS — Z791 Long term (current) use of non-steroidal anti-inflammatories (NSAID): Secondary | ICD-10-CM | POA: Insufficient documentation

## 2018-10-15 DIAGNOSIS — N39498 Other specified urinary incontinence: Secondary | ICD-10-CM | POA: Insufficient documentation

## 2018-10-15 HISTORY — PX: LUMBAR LAMINECTOMY/DECOMPRESSION MICRODISCECTOMY: SHX5026

## 2018-10-15 LAB — ABO/RH: ABO/RH(D): O POS

## 2018-10-15 LAB — POCT PREGNANCY, URINE: Preg Test, Ur: NEGATIVE

## 2018-10-15 SURGERY — LUMBAR LAMINECTOMY/DECOMPRESSION MICRODISCECTOMY 1 LEVEL
Anesthesia: General

## 2018-10-15 MED ORDER — SUGAMMADEX SODIUM 500 MG/5ML IV SOLN
INTRAVENOUS | Status: AC
  Filled 2018-10-15: qty 5

## 2018-10-15 MED ORDER — METHOCARBAMOL 500 MG PO TABS
500.0000 mg | ORAL_TABLET | Freq: Four times a day (QID) | ORAL | Status: DC
  Administered 2018-10-15 – 2018-10-16 (×2): 500 mg via ORAL
  Filled 2018-10-15 (×2): qty 1

## 2018-10-15 MED ORDER — ACETAMINOPHEN 10 MG/ML IV SOLN
INTRAVENOUS | Status: AC
  Filled 2018-10-15: qty 100

## 2018-10-15 MED ORDER — FENTANYL CITRATE (PF) 100 MCG/2ML IJ SOLN
INTRAMUSCULAR | Status: AC
  Filled 2018-10-15: qty 2

## 2018-10-15 MED ORDER — PHENOL 1.4 % MT LIQD
1.0000 | OROMUCOSAL | Status: DC | PRN
  Filled 2018-10-15: qty 177

## 2018-10-15 MED ORDER — ROCURONIUM BROMIDE 100 MG/10ML IV SOLN
INTRAVENOUS | Status: DC | PRN
  Administered 2018-10-15: 20 mg via INTRAVENOUS
  Administered 2018-10-15: 10 mg via INTRAVENOUS

## 2018-10-15 MED ORDER — ONDANSETRON HCL 4 MG/2ML IJ SOLN
INTRAMUSCULAR | Status: DC | PRN
  Administered 2018-10-15: 4 mg via INTRAVENOUS

## 2018-10-15 MED ORDER — SODIUM CHLORIDE 0.9% FLUSH: 3.0000 mL | Freq: Two times a day (BID) | INTRAVENOUS | Status: DC

## 2018-10-15 MED ORDER — PROPOFOL 500 MG/50ML IV EMUL
INTRAVENOUS | Status: AC
  Filled 2018-10-15: qty 50

## 2018-10-15 MED ORDER — SUCCINYLCHOLINE CHLORIDE 20 MG/ML IJ SOLN
INTRAMUSCULAR | Status: AC
  Filled 2018-10-15: qty 1

## 2018-10-15 MED ORDER — PROPOFOL 10 MG/ML IV BOLUS
INTRAVENOUS | Status: AC
  Filled 2018-10-15: qty 20

## 2018-10-15 MED ORDER — THROMBIN 5000 UNITS EX SOLR
CUTANEOUS | Status: AC
  Filled 2018-10-15: qty 5000

## 2018-10-15 MED ORDER — SODIUM CHLORIDE 0.9 % IV SOLN: 250.0000 mL | INTRAVENOUS | Status: DC

## 2018-10-15 MED ORDER — BISACODYL 5 MG PO TBEC: 5.0000 mg | DELAYED_RELEASE_TABLET | Freq: Every day | ORAL | Status: DC | PRN

## 2018-10-15 MED ORDER — LIDOCAINE HCL (CARDIAC) PF 100 MG/5ML IV SOSY
PREFILLED_SYRINGE | INTRAVENOUS | Status: DC | PRN
  Administered 2018-10-15: 100 mg via INTRAVENOUS

## 2018-10-15 MED ORDER — MENTHOL 3 MG MT LOZG
1.0000 | LOZENGE | OROMUCOSAL | Status: DC | PRN
  Filled 2018-10-15: qty 9

## 2018-10-15 MED ORDER — ACETAMINOPHEN 650 MG RE SUPP: 650.0000 mg | RECTAL | Status: DC | PRN

## 2018-10-15 MED ORDER — ACETAMINOPHEN 10 MG/ML IV SOLN
INTRAVENOUS | Status: DC | PRN
  Administered 2018-10-15: 1000 mg via INTRAVENOUS

## 2018-10-15 MED ORDER — SENNA 8.6 MG PO TABS
1.0000 | ORAL_TABLET | Freq: Two times a day (BID) | ORAL | Status: DC
  Administered 2018-10-15 – 2018-10-16 (×2): 8.6 mg via ORAL
  Filled 2018-10-15 (×2): qty 1

## 2018-10-15 MED ORDER — LIDOCAINE HCL (PF) 2 % IJ SOLN
INTRAMUSCULAR | Status: AC
  Filled 2018-10-15: qty 10

## 2018-10-15 MED ORDER — CEFAZOLIN SODIUM-DEXTROSE 2-4 GM/100ML-% IV SOLN
2.0000 g | Freq: Once | INTRAVENOUS | Status: AC
  Administered 2018-10-15: 3 g via INTRAVENOUS

## 2018-10-15 MED ORDER — SODIUM CHLORIDE 0.9% FLUSH: 3.0000 mL | INTRAVENOUS | Status: DC | PRN

## 2018-10-15 MED ORDER — PROPOFOL 10 MG/ML IV BOLUS
INTRAVENOUS | Status: DC | PRN
  Administered 2018-10-15: 200 mg via INTRAVENOUS

## 2018-10-15 MED ORDER — MIDAZOLAM HCL 2 MG/2ML IJ SOLN
INTRAMUSCULAR | Status: DC | PRN
  Administered 2018-10-15: 2 mg via INTRAVENOUS

## 2018-10-15 MED ORDER — FENTANYL CITRATE (PF) 250 MCG/5ML IJ SOLN
INTRAMUSCULAR | Status: AC
  Filled 2018-10-15: qty 5

## 2018-10-15 MED ORDER — ACETAMINOPHEN 500 MG PO TABS
1000.0000 mg | ORAL_TABLET | Freq: Four times a day (QID) | ORAL | Status: DC
  Administered 2018-10-15 – 2018-10-16 (×2): 1000 mg via ORAL
  Filled 2018-10-15 (×2): qty 2

## 2018-10-15 MED ORDER — METHYLPREDNISOLONE ACETATE 40 MG/ML IJ SUSP
INTRAMUSCULAR | Status: DC | PRN
  Administered 2018-10-15: 40 mg

## 2018-10-15 MED ORDER — ACETAMINOPHEN 325 MG PO TABS: 650.0000 mg | ORAL_TABLET | ORAL | Status: DC | PRN

## 2018-10-15 MED ORDER — LACTATED RINGERS IV SOLN
INTRAVENOUS | Status: DC
  Administered 2018-10-15: 15:00:00 via INTRAVENOUS

## 2018-10-15 MED ORDER — ROCURONIUM BROMIDE 50 MG/5ML IV SOLN
INTRAVENOUS | Status: AC
  Filled 2018-10-15: qty 1

## 2018-10-15 MED ORDER — OXYCODONE HCL 5 MG PO TABS
5.0000 mg | ORAL_TABLET | ORAL | Status: DC | PRN
  Administered 2018-10-15 – 2018-10-16 (×3): 5 mg via ORAL
  Filled 2018-10-15 (×3): qty 1

## 2018-10-15 MED ORDER — ONDANSETRON HCL 4 MG/2ML IJ SOLN
INTRAMUSCULAR | Status: AC
  Filled 2018-10-15: qty 2

## 2018-10-15 MED ORDER — DEXAMETHASONE SODIUM PHOSPHATE 10 MG/ML IJ SOLN
INTRAMUSCULAR | Status: DC | PRN
  Administered 2018-10-15: 10 mg via INTRAVENOUS

## 2018-10-15 MED ORDER — SUCCINYLCHOLINE CHLORIDE 20 MG/ML IJ SOLN
INTRAMUSCULAR | Status: DC | PRN
  Administered 2018-10-15: 120 mg via INTRAVENOUS

## 2018-10-15 MED ORDER — REMIFENTANIL HCL 1 MG IV SOLR
INTRAVENOUS | Status: AC
  Filled 2018-10-15: qty 1000

## 2018-10-15 MED ORDER — OXYCODONE HCL 5 MG PO TABS
10.0000 mg | ORAL_TABLET | ORAL | Status: DC | PRN
  Administered 2018-10-16: 10 mg via ORAL
  Filled 2018-10-15: qty 2

## 2018-10-15 MED ORDER — THROMBIN 5000 UNITS EX SOLR
CUTANEOUS | Status: DC | PRN
  Administered 2018-10-15: 5000 [IU] via TOPICAL

## 2018-10-15 MED ORDER — METHOCARBAMOL 1000 MG/10ML IJ SOLN
500.0000 mg | Freq: Four times a day (QID) | INTRAVENOUS | Status: DC
  Filled 2018-10-15: qty 5

## 2018-10-15 MED ORDER — CEFAZOLIN SODIUM-DEXTROSE 2-4 GM/100ML-% IV SOLN
INTRAVENOUS | Status: AC
  Filled 2018-10-15: qty 100

## 2018-10-15 MED ORDER — FENTANYL CITRATE (PF) 100 MCG/2ML IJ SOLN
25.0000 ug | INTRAMUSCULAR | Status: DC | PRN
  Administered 2018-10-15: 25 ug via INTRAVENOUS
  Administered 2018-10-15: 50 ug via INTRAVENOUS

## 2018-10-15 MED ORDER — SODIUM CHLORIDE 0.9 % IV SOLN
INTRAVENOUS | Status: DC | PRN
  Administered 2018-10-15: 50 ug/min via INTRAVENOUS

## 2018-10-15 MED ORDER — PROPOFOL 500 MG/50ML IV EMUL
INTRAVENOUS | Status: DC | PRN
  Administered 2018-10-15: 75 ug/kg/min via INTRAVENOUS

## 2018-10-15 MED ORDER — HYDROMORPHONE HCL 1 MG/ML IJ SOLN: 0.5000 mg | INTRAMUSCULAR | Status: DC | PRN

## 2018-10-15 MED ORDER — REMIFENTANIL HCL 1 MG IV SOLR
INTRAVENOUS | Status: DC | PRN
  Administered 2018-10-15: .1 ug/kg/min via INTRAVENOUS

## 2018-10-15 MED ORDER — LIDOCAINE-EPINEPHRINE (PF) 1 %-1:200000 IJ SOLN
INTRAMUSCULAR | Status: DC | PRN
  Administered 2018-10-15: 10 mL

## 2018-10-15 MED ORDER — SODIUM CHLORIDE 0.9 % IR SOLN
Status: DC | PRN
  Administered 2018-10-15: 1000 mL

## 2018-10-15 MED ORDER — FENTANYL CITRATE (PF) 100 MCG/2ML IJ SOLN
INTRAMUSCULAR | Status: DC | PRN
  Administered 2018-10-15: 50 ug via INTRAVENOUS
  Administered 2018-10-15: 100 ug via INTRAVENOUS
  Administered 2018-10-15 (×2): 50 ug via INTRAVENOUS
  Administered 2018-10-15: 100 ug via INTRAVENOUS
  Administered 2018-10-15: 50 ug via INTRAVENOUS

## 2018-10-15 MED ORDER — GABAPENTIN 300 MG PO CAPS
300.0000 mg | ORAL_CAPSULE | Freq: Two times a day (BID) | ORAL | Status: DC
  Administered 2018-10-15 – 2018-10-16 (×2): 300 mg via ORAL
  Filled 2018-10-15 (×2): qty 1

## 2018-10-15 MED ORDER — MIDAZOLAM HCL 2 MG/2ML IJ SOLN
INTRAMUSCULAR | Status: AC
  Filled 2018-10-15: qty 2

## 2018-10-15 MED ORDER — FAMOTIDINE 20 MG PO TABS
ORAL_TABLET | ORAL | Status: AC
  Filled 2018-10-15: qty 1

## 2018-10-15 MED ORDER — BUPIVACAINE HCL (PF) 0.5 % IJ SOLN
INTRAMUSCULAR | Status: AC
  Filled 2018-10-15: qty 30

## 2018-10-15 MED ORDER — SODIUM CHLORIDE 0.9 % IV SOLN
INTRAVENOUS | Status: DC
  Administered 2018-10-15: 22:00:00 via INTRAVENOUS

## 2018-10-15 MED ORDER — DEXMEDETOMIDINE HCL IN NACL 200 MCG/50ML IV SOLN
INTRAVENOUS | Status: DC | PRN
  Administered 2018-10-15 (×2): 12 ug via INTRAVENOUS

## 2018-10-15 MED ORDER — POLYETHYLENE GLYCOL 3350 17 G PO PACK: 17.0000 g | PACK | Freq: Every day | ORAL | Status: DC | PRN

## 2018-10-15 MED ORDER — BACITRACIN 50000 UNITS IM SOLR
INTRAMUSCULAR | Status: AC
  Filled 2018-10-15: qty 1

## 2018-10-15 MED ORDER — LIDOCAINE-EPINEPHRINE (PF) 1 %-1:200000 IJ SOLN
INTRAMUSCULAR | Status: AC
  Filled 2018-10-15: qty 30

## 2018-10-15 MED ORDER — ONDANSETRON HCL 4 MG/2ML IJ SOLN: 4.0000 mg | Freq: Four times a day (QID) | INTRAMUSCULAR | Status: DC | PRN

## 2018-10-15 MED ORDER — VANCOMYCIN HCL 1000 MG IV SOLR
INTRAVENOUS | Status: AC
  Filled 2018-10-15: qty 1000

## 2018-10-15 MED ORDER — FAMOTIDINE 20 MG PO TABS
20.0000 mg | ORAL_TABLET | Freq: Once | ORAL | Status: AC
  Administered 2018-10-15: 20 mg via ORAL

## 2018-10-15 MED ORDER — ONDANSETRON HCL 4 MG PO TABS: 4.0000 mg | ORAL_TABLET | Freq: Four times a day (QID) | ORAL | Status: DC | PRN

## 2018-10-15 MED ORDER — MAGNESIUM CITRATE PO SOLN
1.0000 | Freq: Once | ORAL | Status: DC | PRN
  Filled 2018-10-15: qty 296

## 2018-10-15 MED ORDER — SODIUM CHLORIDE FLUSH 0.9 % IV SOLN
INTRAVENOUS | Status: AC
  Filled 2018-10-15: qty 10

## 2018-10-15 SURGICAL SUPPLY — 60 items
ADH SKN CLS APL DERMABOND .7 (GAUZE/BANDAGES/DRESSINGS) ×1
AGENT HMST MTR 8 SURGIFLO (HEMOSTASIS) ×1
APL SRG 60D 8 XTD TIP BNDBL (TIP)
BUR NEURO DRILL SOFT 3.0X3.8M (BURR) ×3 IMPLANT
CANISTER SUCT 1200ML W/VALVE (MISCELLANEOUS) ×6 IMPLANT
CHLORAPREP W/TINT 26ML (MISCELLANEOUS) ×6 IMPLANT
COUNTER NEEDLE 20/40 LG (NEEDLE) ×3 IMPLANT
COVER LIGHT HANDLE STERIS (MISCELLANEOUS) ×6 IMPLANT
COVER WAND RF STERILE (DRAPES) ×1 IMPLANT
CUP MEDICINE 2OZ PLAST GRAD ST (MISCELLANEOUS) ×3 IMPLANT
DERMABOND ADVANCED (GAUZE/BANDAGES/DRESSINGS) ×2
DERMABOND ADVANCED .7 DNX12 (GAUZE/BANDAGES/DRESSINGS) ×1 IMPLANT
DRAPE C-ARM 42X72 X-RAY (DRAPES) ×6 IMPLANT
DRAPE LAPAROTOMY 100X77 ABD (DRAPES) ×3 IMPLANT
DRAPE MICROSCOPE SPINE 48X150 (DRAPES) ×2 IMPLANT
DRAPE SURG 17X11 SM STRL (DRAPES) ×3 IMPLANT
DURASEAL APPLICATOR TIP (TIP) IMPLANT
DURASEAL SPINE SEALANT 3ML (MISCELLANEOUS) IMPLANT
ELECT CAUTERY BLADE TIP 2.5 (TIP) ×3
ELECT EZSTD 165MM 6.5IN (MISCELLANEOUS) ×3
ELECT REM PT RETURN 9FT ADLT (ELECTROSURGICAL) ×3
ELECTRODE CAUTERY BLDE TIP 2.5 (TIP) ×1 IMPLANT
ELECTRODE EZSTD 165MM 6.5IN (MISCELLANEOUS) ×1 IMPLANT
ELECTRODE REM PT RTRN 9FT ADLT (ELECTROSURGICAL) ×1 IMPLANT
GAUZE SPONGE 4X4 12PLY STRL (GAUZE/BANDAGES/DRESSINGS) ×3 IMPLANT
GLOVE BIOGEL PI IND STRL 7.0 (GLOVE) ×1 IMPLANT
GLOVE BIOGEL PI IND STRL 8 (GLOVE) ×1 IMPLANT
GLOVE BIOGEL PI INDICATOR 7.0 (GLOVE) ×2
GLOVE BIOGEL PI INDICATOR 8 (GLOVE) ×2
GLOVE SURG SYN 7.0 (GLOVE) ×6 IMPLANT
GLOVE SURG SYN 7.0 PF PI (GLOVE) ×2 IMPLANT
GLOVE SURG SYN 8.0 (GLOVE) ×12 IMPLANT
GLOVE SURG SYN 8.0 PF PI (GLOVE) ×3 IMPLANT
GOWN STRL REUS W/ TWL XL LVL3 (GOWN DISPOSABLE) ×1 IMPLANT
GOWN STRL REUS W/TWL MED LVL3 (GOWN DISPOSABLE) ×3 IMPLANT
GOWN STRL REUS W/TWL XL LVL3 (GOWN DISPOSABLE) ×6
GRADUATE 1200CC STRL 31836 (MISCELLANEOUS) ×3 IMPLANT
KIT TURNOVER KIT A (KITS) ×3 IMPLANT
KIT WILSON FRAME (KITS) ×3 IMPLANT
KNIFE BAYONET SHORT DISCETOMY (MISCELLANEOUS) ×2 IMPLANT
MARKER SKIN DUAL TIP RULER LAB (MISCELLANEOUS) ×6 IMPLANT
NDL SAFETY ECLIPSE 18X1.5 (NEEDLE) ×1 IMPLANT
NEEDLE HYPO 18GX1.5 SHARP (NEEDLE) ×6
NEEDLE HYPO 22GX1.5 SAFETY (NEEDLE) ×3 IMPLANT
NS IRRIG 1000ML POUR BTL (IV SOLUTION) ×3 IMPLANT
PACK LAMINECTOMY NEURO (CUSTOM PROCEDURE TRAY) ×3 IMPLANT
PAD ARMBOARD 7.5X6 YLW CONV (MISCELLANEOUS) ×3 IMPLANT
SPOGE SURGIFLO 8M (HEMOSTASIS) ×2
SPONGE SURGIFLO 8M (HEMOSTASIS) ×1 IMPLANT
STAPLER SKIN PROX 35W (STAPLE) IMPLANT
SUT NURALON 4 0 TR CR/8 (SUTURE) IMPLANT
SUT POLYSORB 2-0 5X18 GS-10 (SUTURE) ×3 IMPLANT
SUT VIC AB 0 CT1 18XCR BRD 8 (SUTURE) ×1 IMPLANT
SUT VIC AB 0 CT1 8-18 (SUTURE) ×3
SYR 10ML LL (SYRINGE) ×6 IMPLANT
SYR 30ML LL (SYRINGE) ×3 IMPLANT
SYR 3ML LL SCALE MARK (SYRINGE) ×3 IMPLANT
TOWEL OR 17X26 4PK STRL BLUE (TOWEL DISPOSABLE) ×10 IMPLANT
TUBING CONNECTING 10 (TUBING) ×2 IMPLANT
TUBING CONNECTING 10' (TUBING) ×1

## 2018-10-15 NOTE — Transfer of Care (Signed)
Immediate Anesthesia Transfer of Care Note  Patient: Julia Savage  Procedure(s) Performed: LUMBAR LAMINECTOMY/DECOMPRESSION MICRODISCECTOMY 1 LEVEL (N/A )  Patient Location: PACU  Anesthesia Type:General  Level of Consciousness: sedated  Airway & Oxygen Therapy: Patient Spontanous Breathing and Patient connected to face mask oxygen  Post-op Assessment: Report given to RN and Post -op Vital signs reviewed and stable  Post vital signs: Reviewed and stable  Last Vitals:  Vitals Value Taken Time  BP 135/76 10/15/2018  7:27 PM  Temp    Pulse 95 10/15/2018  7:29 PM  Resp 25 10/15/2018  7:29 PM  SpO2 100 % 10/15/2018  7:29 PM  Vitals shown include unvalidated device data.  Last Pain:  Vitals:   10/15/18 1202  TempSrc: Temporal  PainSc: 0-No pain         Complications: No apparent anesthesia complications

## 2018-10-15 NOTE — Op Note (Signed)
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 She underwent conservative management to include OTC medications, steroid injection, and prescription medications without relief. MRI shows a large extruded disc herniation on the right L4/5 area. Given the symptoms, a right L4/5 hemilaminectomy and discectomy was recommended. The risks of surgery were explained to include hematoma, infection, damage to nerve roots, CSF leak, weakness, numbness, pain, need for future surgery, heart attack, and stroke. He elected to proceed with surgery for symptom relief.   Procedure The patient was brought to the OR after informed consent was obtained. She was given general anesthesia and intubated by the anesthesia service. Vascular access lines were placed.The patient was then placed prone on a Wilson frame ensuring all pressure points were padded. A time-out was performed per protocol.   The patient was sterilely prepped and draped. The fluoroscopy unit was used to identify the L4/5 interspace and an incision marked in midline at that level. Local anesthetic was instilled. The skin was incised and taken to the fascia. The fascia was dissected from the lamina and spinous process of the L4/5 level bilaterally. The level was again confirmed.  The microscope was brought into the field.   Next, a 3 mm drill bit was used to remove the inferior L4 lamina and superior L5 lamina  as well as the medial facet. The underlying ligament was freed and removed with combination of rongeurs. The dura was then seen which was compressed medially. All overlying ligament was removed which was significant.  The dura was then retracted medially and the traversing nerve root identified. The dura was very adherent to the disc space and a firm bulge could be seen here. This disc space was entered. There were small disc fragments seen but no large soft disc. After multiple rounds of dissection with curette and probe, some large firm pieces of disc were removed. The disc space was inspected and some additional fragments were removed. The dura was seen to be free and more compressible after that. There was no sign of CSF and the traversing nerve root was free.   Hemostasis was achieved. Depo-medrol was placed along the nerve root.   The retractors were removed. The fascia was re-approximated with a 0 vicryl.  Next, multiple subcutaneous and dermal layers were closed with 2-0 vicryl until the epidermis was well approximated. The skin was closed with staples  The patient was returned to supine position and extubated by the anesthesia service. The patient was then taken to the PACU for post-operative care where she was moving extremities symmetrically.    ESTIMATED BLOOD LOSS: 50cc  SPECIMENS None  IMPLANT None   I performed the case in its entiretywith assistance of PA, Dorian Furnace, MD [phone]

## 2018-10-15 NOTE — Anesthesia Post-op Follow-up Note (Signed)
Anesthesia QCDR form completed.        

## 2018-10-15 NOTE — H&P (Signed)
Julia Savage is an 43 y.o. female.   Chief Complaint: Numbness in legs HPI: Julia Savage is here for evaluation of ongoing symptoms of numbness, bowel and bladder changes, and pain that she says started approximately 2-3 months ago. She has a chronic history of back pain and she had a previous injection many years ago. She says she has been having more back pain over the past few years but she had more acute symptoms onset in November. This was followed by pain that traveled down the legs, mostly on the left she states. There is also associated with some numbness that used to go in the legs to the feet. She did get an epidural injection performed and she said this did subside some of that numbness. However, she still experiencing numbness in the buttocks and groin area. Additionally, she has noticed some difficulty with walking and feels like she must regain her strength. She has had at least 3 episodes of stool incontinence and more frequent urinary incontinence. She states that she is unable to hold her urine at times when coughing or straining.  MRI of the lumbar spine shows a large disc herniation at L4/5 and she is here for decompression   Past Medical History:  Diagnosis Date  . Back pain   . Medical history non-contributory     Past Surgical History:  Procedure Laterality Date  . NO PAST SURGERIES      History reviewed. No pertinent family history. Social History:  reports that she has been smoking cigarettes. She has been smoking about 0.50 packs per day. She has never used smokeless tobacco. She reports current alcohol use. She reports that she does not use drugs.  Allergies:  Allergies  Allergen Reactions  . Hydrocodone Rash  . Percocet [Oxycodone-Acetaminophen] Rash  . Vicodin [Hydrocodone-Acetaminophen] Rash    Medications Prior to Admission  Medication Sig Dispense Refill  . gabapentin (NEURONTIN) 300 MG capsule Take 300 mg by mouth 2 (two) times daily.    Marland Kitchen tiZANidine  (ZANAFLEX) 4 MG tablet Take 4 mg by mouth at bedtime as needed for muscle spasms.    . meloxicam (MOBIC) 15 MG tablet Take 15 mg by mouth daily.      Results for orders placed or performed during the hospital encounter of 10/15/18 (from the past 48 hour(s))  ABO/Rh     Status: None   Collection Time: 10/15/18 12:36 PM  Result Value Ref Range   ABO/RH(D)      O POS Performed at [facility], [address] [place], [place] [address]   Pregnancy, urine POC     Status: None   Collection Time: 10/15/18 12:36 PM  Result Value Ref Range   Preg Test, Ur NEGATIVE NEGATIVE    Comment:        THE SENSITIVITY OF THIS METHODOLOGY IS >24 mIU/mL    No results found.  ROS General ROS: Negative Psychological ROS: Negative Ophthalmic ROS: Negative ENT ROS: Negative Hematological and Lymphatic ROS: Negative  Endocrine ROS: Negative Respiratory ROS: Negative Cardiovascular ROS: Negative Gastrointestinal ROS: Positive for intermittent stool incontinence Genito-Urinary ROS: Positive for urinary incontinence Musculoskeletal ROS: Positive for back pain Neurological ROS: Positive for numbness Dermatological ROS: Negative  Pulse 93, temperature 97.7 F (36.5 C), temperature source Temporal, resp. rate 16, height 5\' 7"  (1.702 m), weight (!) 142.9 kg, last menstrual period 09/23/2018, SpO2 99 %. Physical Exam  General appearance: Alert, cooperative, in no acute distress Head: Normocephalic, atraumatic Eyes: Normal, EOM intact  Oropharynx: Moist without lesions Ext: Edema noted in lower extremities CV: Regular rate Pulm: Clear to auscultation   Neurologic exam:  Mental status: alertness: alert, affect: normal Speech: fluent and clear Motor:strength symmetric 5/5 in hip flexion, knee flexion, knee extension, dorsiflexion, plantarflexion Sensory: Appears intact in the thighs and distally in the lower extremities Gait: normal    Assessment/Plan L4-5 decompression with  discectomy   Lucy Chris, MD 10/15/2018, 2:05 PM

## 2018-10-15 NOTE — Anesthesia Procedure Notes (Signed)
Procedure Name: Intubation Date/Time: 10/15/2018 3:00 PM Performed by: Dava Najjar, CRNA Pre-anesthesia Checklist: Patient identified, Emergency Drugs available, Suction available and Patient being monitored Patient Re-evaluated:Patient Re-evaluated prior to induction Oxygen Delivery Method: Circle system utilized Preoxygenation: Pre-oxygenation with 100% oxygen Induction Type: IV induction Ventilation: Mask ventilation without difficulty Laryngoscope Size: McGraph and 3 Grade View: Grade I Tube type: Oral Tube size: 7.0 mm Number of attempts: 2 Airway Equipment and Method: Stylet and Video-laryngoscopy Placement Confirmation: positive ETCO2 and breath sounds checked- equal and bilateral Secured at: 22 cm Tube secured with: Tape Dental Injury: Teeth and Oropharynx as per pre-operative assessment

## 2018-10-15 NOTE — Anesthesia Preprocedure Evaluation (Signed)
Anesthesia Evaluation  Patient identified by MRN, date of birth, ID band Patient awake    Reviewed: Allergy & Precautions, H&P , NPO status , Patient's Chart, lab work & pertinent test results  Airway Mallampati: III  TM Distance: >3 FB Neck ROM: full    Dental  (+) Partial Lower, Missing   Pulmonary Current Smoker,           Cardiovascular negative cardio ROS       Neuro/Psych negative neurological ROS  negative psych ROS   GI/Hepatic negative GI ROS, Neg liver ROS,   Endo/Other  Morbid obesity  Renal/GU      Musculoskeletal   Abdominal   Peds  Hematology negative hematology ROS (+)   Anesthesia Other Findings Past Medical History: No date: Back pain No date: Medical history non-contributory  Past Surgical History: No date: NO PAST SURGERIES  BMI    Body Mass Index:  49.34 kg/m      Reproductive/Obstetrics negative OB ROS                             Anesthesia Physical Anesthesia Plan  ASA: III  Anesthesia Plan: General ETT   Post-op Pain Management:    Induction:   PONV Risk Score and Plan: Ondansetron, Dexamethasone, Midazolam and Treatment may vary due to age or medical condition  Airway Management Planned:   Additional Equipment:   Intra-op Plan:   Post-operative Plan:   Informed Consent: I have reviewed the patients History and Physical, chart, labs and discussed the procedure including the risks, benefits and alternatives for the proposed anesthesia with the patient or authorized representative who has indicated his/her understanding and acceptance.     Dental Advisory Given  Plan Discussed with: Anesthesiologist and CRNA  Anesthesia Plan Comments:         Anesthesia Quick Evaluation

## 2018-10-16 ENCOUNTER — Encounter: Payer: Self-pay | Admitting: Neurosurgery

## 2018-10-16 DIAGNOSIS — M48061 Spinal stenosis, lumbar region without neurogenic claudication: Secondary | ICD-10-CM | POA: Diagnosis not present

## 2018-10-16 MED ORDER — METHOCARBAMOL 500 MG PO TABS: 500.0000 mg | ORAL_TABLET | Freq: Four times a day (QID) | ORAL | 0 refills | Status: DC

## 2018-10-16 MED ORDER — OXYCODONE HCL 5 MG PO TABS: 5.0000 mg | ORAL_TABLET | ORAL | 0 refills | Status: DC | PRN

## 2018-10-16 NOTE — Progress Notes (Signed)
Patient is alert and oriented and able to verbalize needs. No complaints of pain at this time. VSS. PIV removed. Telemetry removed. Printed AVS and script for Oxycodone given to patient at this time. Significant other at bedside. All discharge instructions gone over with both. Pt verbalizes understanding. Back brace applied.   Suzan Slick, RN

## 2018-10-16 NOTE — Discharge Instructions (Signed)

## 2018-10-16 NOTE — Discharge Summary (Signed)
Procedure: L4-5 lumbar decompression Procedure date: 10/15/2018 Diagnosis: Lower extremity numbness and bladder dysfunction  History: Julia Savage is POD 1 s/p L4-5 lumbar decompression for lower extremity numbness and bladder dysfunction.  Complains of pain at the incision site but this is been adequately controlled with medications.  She has been able to void without issue and feels urinary retention has improved.  Lower extremity numbness has also resolved.  Denies any new lower extremity pain, numbness , tingling, weakness. She was able to eat a sandwich last night without any issue.    Physical Exam: Vitals:   10/16/18 0306 10/16/18 0800  BP: 139/73 111/64  Pulse: 79 76  Resp: 19 (!) 24  Temp: 98.8 F (37.1 C) 98.6 F (37 C)  SpO2: 94% 98%    AA Ox3 Strength:5/5 throughout upper and lower extremities Sensation: Intact and symmetric throughout upper and lower extremities Skin: Dressing at incision clean and dry.  Data:  No results for input(s): NA, K, CL, CO2, BUN, CREATININE, LABGLOM, GLUCOSE, CALCIUM in the last 168 hours. No results for input(s): AST, ALT, ALKPHOS in the last 168 hours.  Invalid input(s): TBILI   No results for input(s): WBC, HGB, HCT, PLT in the last 168 hours. No results for input(s): APTT, INR in the last 168 hours.       Other tests/results: No imaging reviewed  Assessment/Plan:  Julia Savage is POD1 status post L4-5 lumbar decompression for lower extremity numbness and bladder dysfunction.  She is recovering well.  Pain adequately controlled and she has been able to ambulate and void without issue.  Neuro intact.  We will continue pain control with oxycodone, Tylenol, ibuprofen, Robaxin as needed.  She is scheduled to follow-up in approximately 2 weeks in clinic to monitor progress.  Advised to contact office if any questions or concerns arise before then.  Ivar Drape PA-C Department of Neurosurgery

## 2018-10-16 NOTE — Progress Notes (Signed)
Pt is A&OX4. Tolerating ambulation to restroom tonight and voiding without trouble. Denies numbness or tingling. Actively moves all extremities. Surgical site clean, dry, intact. Pt educated on mobility restrictions and safety measures.

## 2018-10-17 NOTE — Anesthesia Postprocedure Evaluation (Signed)
Anesthesia Post Note  Patient: Julia Savage  Procedure(s) Performed: LUMBAR LAMINECTOMY/DECOMPRESSION MICRODISCECTOMY 1 LEVEL (N/A )  Patient location during evaluation: PACU Anesthesia Type: General Level of consciousness: awake and alert Pain management: pain level controlled Vital Signs Assessment: post-procedure vital signs reviewed and stable Respiratory status: spontaneous breathing, nonlabored ventilation, respiratory function stable and patient connected to nasal cannula oxygen Cardiovascular status: blood pressure returned to baseline and stable Postop Assessment: no apparent nausea or vomiting Anesthetic complications: no     Last Vitals:  Vitals:   10/16/18 1000 10/16/18 1019  BP: (!) 112/57   Pulse: 90   Resp: 20   Temp:  36.6 C  SpO2: 99%     Last Pain:  Vitals:   10/16/18 1019  TempSrc: Oral  PainSc:                  [name]

## 2020-11-28 IMAGING — MR MR LUMBAR SPINE W/O CM
4 of 5 series · 26 of 48 positions shown · non-contrast
Comparison: Lumbar spine radiograph 07/24/2018

CLINICAL DATA: Low back pain with bilateral lower extremity
numbness and weakness

EXAM:
MRI LUMBAR SPINE WITHOUT CONTRAST
TECHNIQUE: Multiplanar, multisequence MR imaging of the lumbar spine was
performed. No intravenous contrast was administered.

[Series 3: T2 · sagittal · 4.0mm · 0.81mm/px · 6 of 15 slices shown (1 of 2)]
[im 1/15]
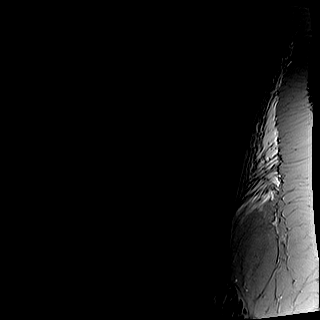
[im 3/15]
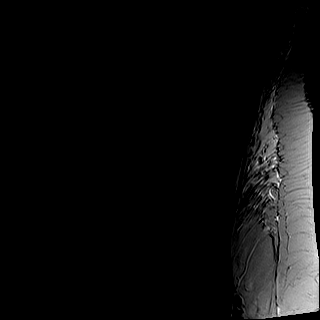
[im 6/15]
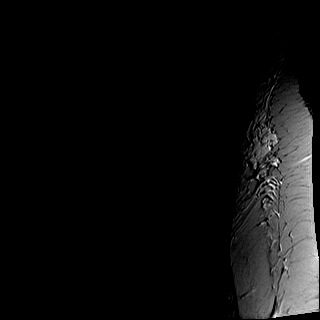
[im 9/15]
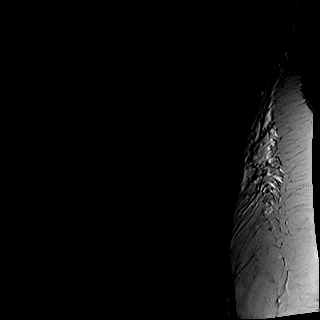
[im 12/15]
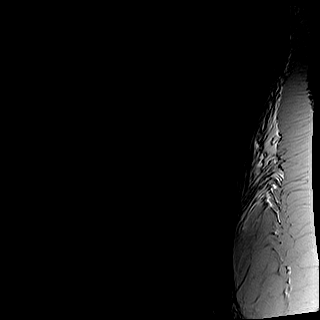
[im 15/15]
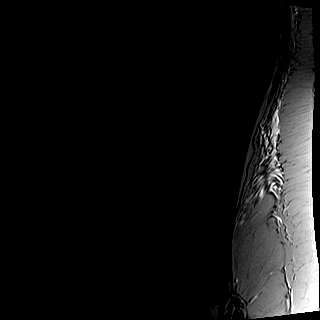

[Series 4: T1 · sagittal · 4.0mm · 0.41mm/px · 6 of 15 slices shown (1 of 2)]
[im 1/15]
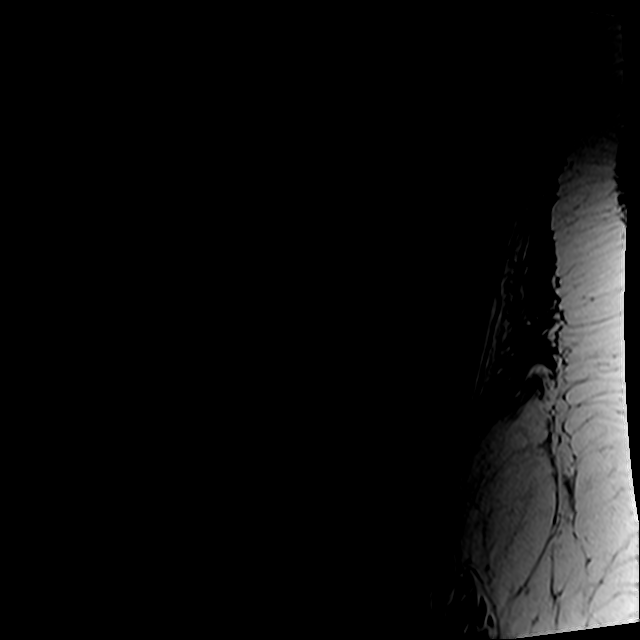
[im 3/15]
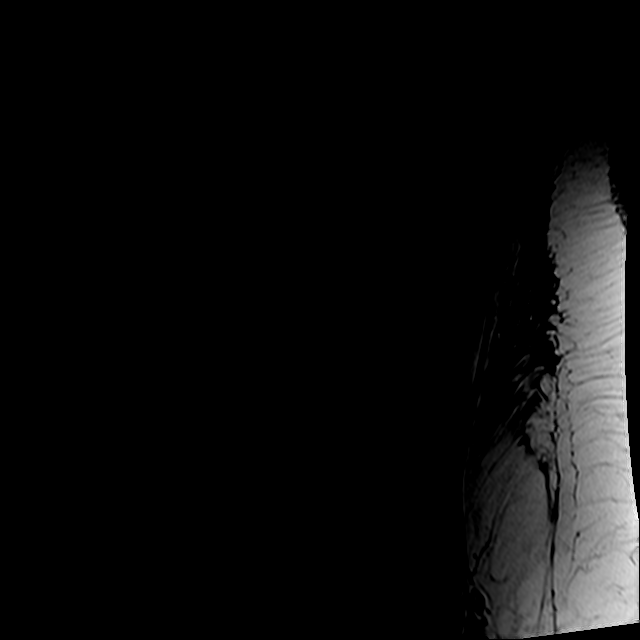
[im 6/15]
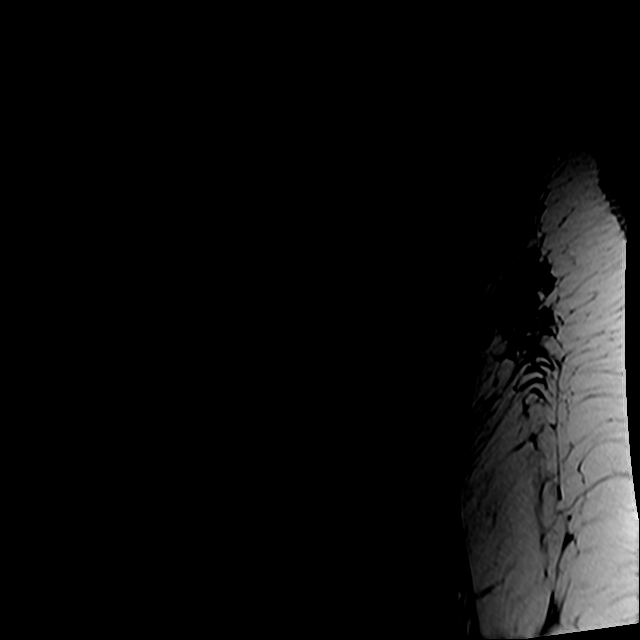
[im 9/15]
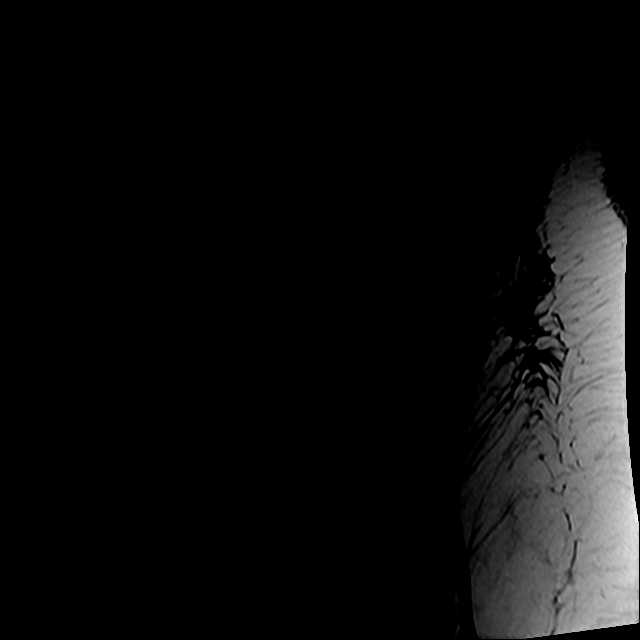
[im 12/15]
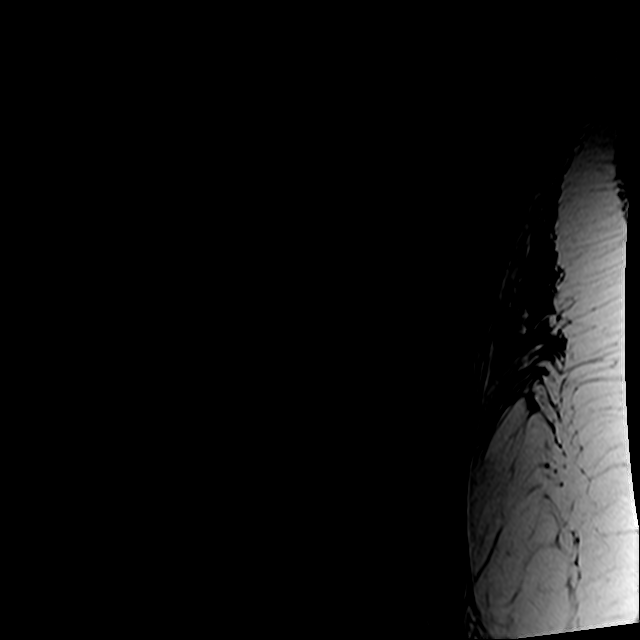
[im 15/15]
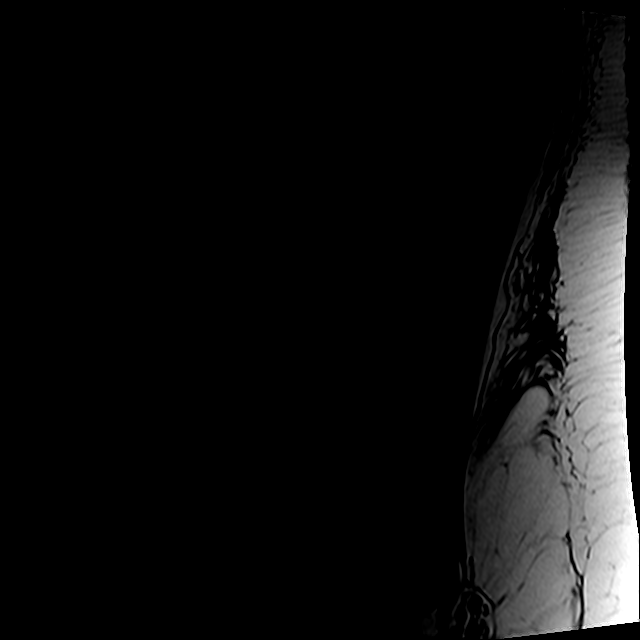

[Series 6: T2 · axial · 4.0mm · 0.78mm/px · z∈[-48,+171]mm · 9 of 40 slices shown (2 of 2)]
[im 1/40]
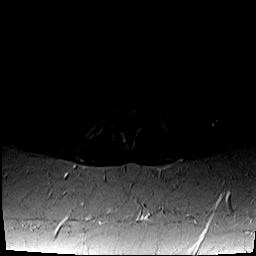
[im 6/40]
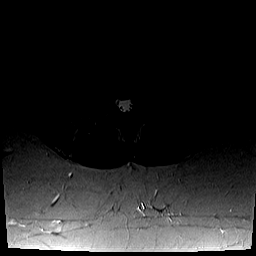
[im 12/40]
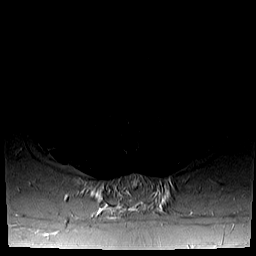
[im 17/40]
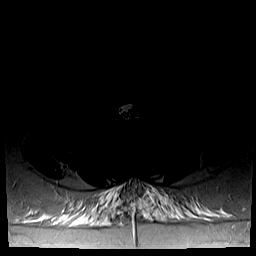
[im 20/40]
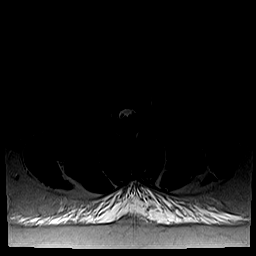
[im 23/40]
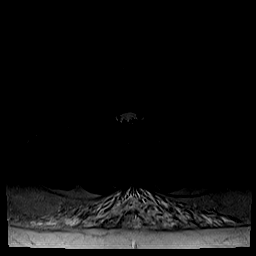
[im 28/40]
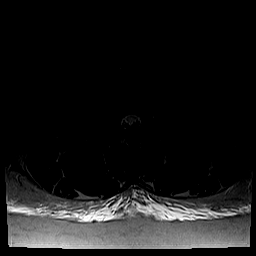
[im 34/40]
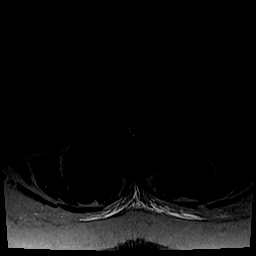
[im 40/40]
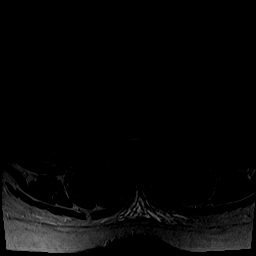

[Series 7: T1 · axial · 4.0mm · 0.39mm/px · z∈[-48,+141]mm · 5 of 40 slices shown (2 of 2)]
[im 1/40]
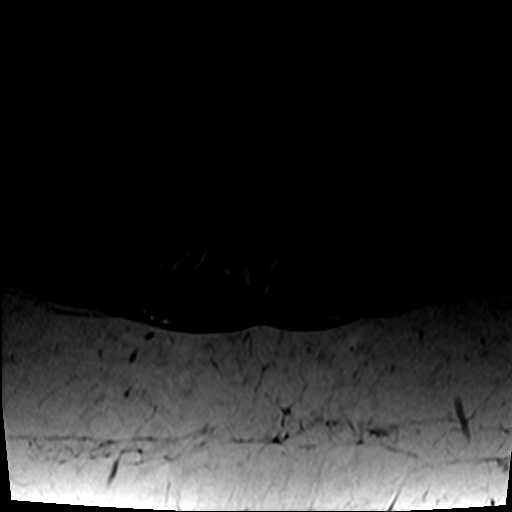
[im 6/40]
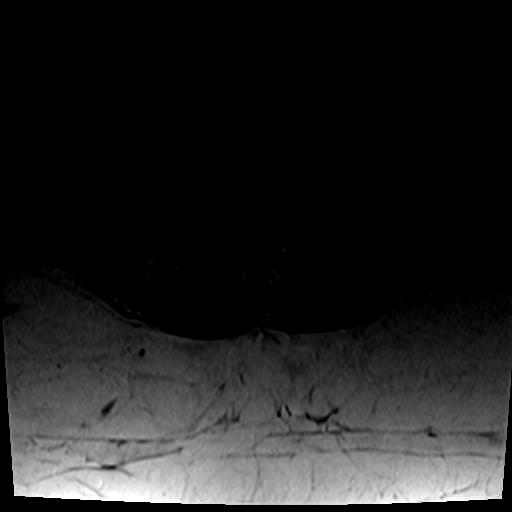
[im 12/40]
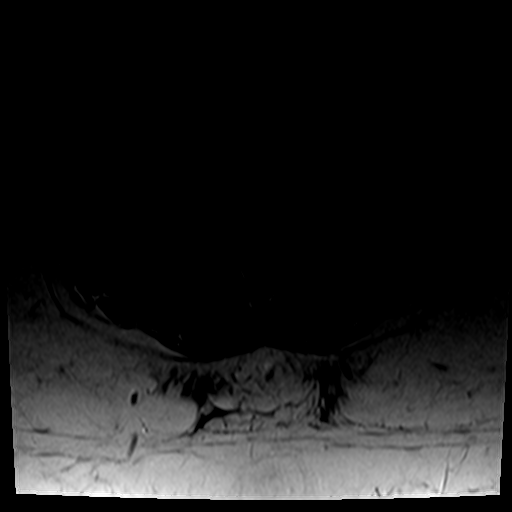
[im 20/40]
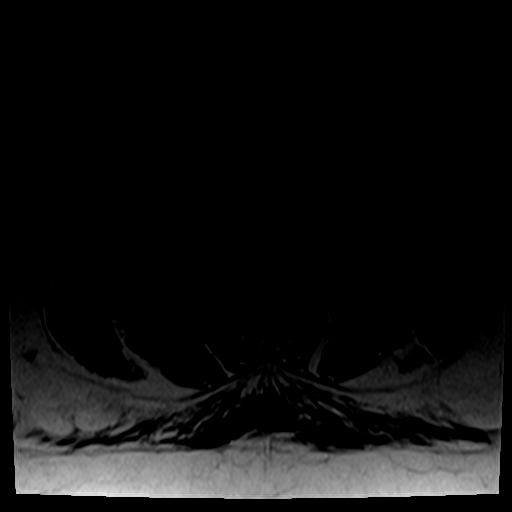
[im 34/40]
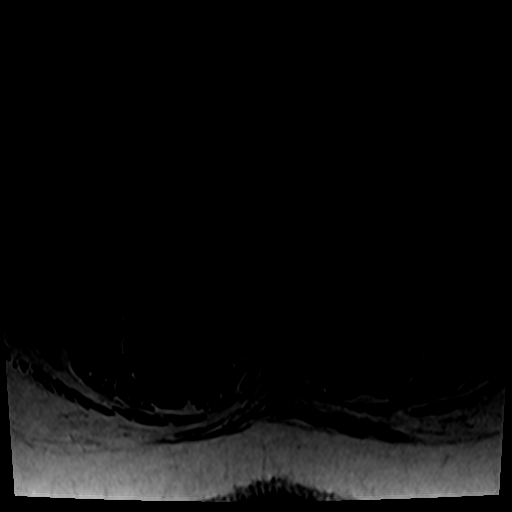

[26 of 48 positions shown; findings below may reference images not displayed]

FINDINGS: Segmentation: Normal. The lowest disc space is considered to be
L5-S1.

Alignment:  Normal

Vertebrae: No acute compression fracture, discitis-osteomyelitis of
focal marrow lesion.

Conus medullaris and cauda equina: The conus medullaris terminates
at the L1 level. The cauda equina and conus medullaris are both
normal.

Paraspinal and other soft tissues: The visualized retroperitoneal
organs and paraspinal soft tissues are normal.

Disc levels: Sagittal plane imaging includes the T11-12 disc level
through the upper sacrum, with axial imaging of the T12-L1 to L5-S1
disc levels.

L4-5: There is a large central disc extrusion with superior
migration to the infrapedicle level of L4. This causes severe spinal
canal stenosis. Mild right foraminal stenosis.

The visualized portion of the sacrum is normal.
IMPRESSION: Severe L4-5 spinal canal stenosis with impingement of the cauda
equina, due to large central disc extrusion with mild superior
migration.

These results will be called to the ordering clinician or
representative by the Radiologist Assistant, and communication
documented in the PACS or zVision Dashboard.

## 2021-03-23 ENCOUNTER — Ambulatory Visit (INDEPENDENT_AMBULATORY_CARE_PROVIDER_SITE_OTHER): Payer: BC Managed Care – PPO

## 2021-03-23 ENCOUNTER — Other Ambulatory Visit: Payer: Self-pay

## 2021-03-23 ENCOUNTER — Ambulatory Visit: Payer: BC Managed Care – PPO

## 2021-03-23 ENCOUNTER — Ambulatory Visit (INDEPENDENT_AMBULATORY_CARE_PROVIDER_SITE_OTHER): Payer: BC Managed Care – PPO | Admitting: Podiatry

## 2021-03-23 DIAGNOSIS — M2042 Other hammer toe(s) (acquired), left foot: Secondary | ICD-10-CM

## 2021-03-23 DIAGNOSIS — M2041 Other hammer toe(s) (acquired), right foot: Secondary | ICD-10-CM

## 2021-03-23 DIAGNOSIS — M2012 Hallux valgus (acquired), left foot: Secondary | ICD-10-CM

## 2021-03-23 DIAGNOSIS — M2011 Hallux valgus (acquired), right foot: Secondary | ICD-10-CM

## 2021-03-23 NOTE — Progress Notes (Signed)
   Subjective: 45 y.o. female presenting today as a new patient for evaluation of symptomatic bunions that is been present for several years.  Patient states that she works on her feet all day and steel toed boots and they are very aggravating and painful and causing calluses to her bilateral feet.  She is aware that the only way to truly correct for the bunion is surgery.  She presents today for possible surgical consultation and further treatment and evaluation   Past Medical History:  Diagnosis Date   Back pain    Medical history non-contributory      Objective: Physical Exam General: The patient is alert and oriented x3 in no acute distress.  Dermatology: Skin is cool, dry and supple bilateral lower extremities. Negative for open lesions or macerations.  Vascular: Palpable pedal pulses bilaterally. No edema or erythema noted. Capillary refill within normal limits.  Neurological: Epicritic and protective threshold grossly intact bilaterally.   Musculoskeletal Exam: Clinical evidence of bunion deformity noted to the respective foot. There is moderate pain on palpation range of motion of the first MPJ. Lateral deviation of the hallux noted consistent with hallux abductovalgus. Hammertoe contracture also noted on clinical exam to digits 2-4 of the bilateral foot. Symptomatic pain on palpation and range of motion also noted to the metatarsal phalangeal joints of the respective hammertoe digits.    Radiographic Exam: Increased intermetatarsal angle greater than 15 with a hallux abductus angle greater than 30 noted on AP view. Moderate degenerative changes noted within the first MPJ. Contracture deformity also noted to the interphalangeal joints and MPJs of the digits of the respective hammertoes.    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 Hammertoe deformity 2-4 bilateral   Plan of Care:  1. Patient was evaluated. X-Rays reviewed. 2. Today we discussed the  conservative versus surgical management of the presenting pathology. The patient opts for surgical management. All possible complications and details of the procedure were explained. All patient questions were answered. No guarantees were expressed or implied. 3. Authorization for surgery was initiated today. Surgery will consist of Lapidus bunionectomy RT. PIPJ arthroplasty with MTPJ capsulotomy 2-4 RT.  Weil shortening osteotomy second metatarsal RT. 4.  Return to clinic 1 week postop  *Works at [employer] center. *Wife's name is April. She was not present at today's appt.      Felecia Shelling, DPM [facility]  Dr. Felecia Shelling, DPM    [address] [place], [place] [address]                Office [phone]  Fax [phone]

## 2023-01-03 NOTE — Progress Notes (Signed)
No show for appointment. Office will call to reschedule.  

## 2023-01-04 ENCOUNTER — Ambulatory Visit: Payer: BC Managed Care – PPO | Admitting: Internal Medicine

## 2023-01-04 DIAGNOSIS — Z91199 Patient's noncompliance with other medical treatment and regimen due to unspecified reason: Secondary | ICD-10-CM

## 2023-03-27 NOTE — Progress Notes (Unsigned)
Sleep Medicine   Office Visit  Patient Name: Julia Savage DOB: 02-Feb-1976 MRN [id number]    Chief Complaint: OSA  Brief History:  Julia Savage presents for an initial consult for sleep evaluation and to establish care. Patient has a 2 year history of sleep apnea. Sleep quality is poor. This is noted every nights. The patient's bed partner reports  snoring, gasping, choking and apneic pauses at night. The patient relates the following symptoms: occasional headaches, brain fogginess, trouble concentrating, fatigue are also present. The patient goes to sleep at 1000 pm and wakes up at 0530 am and will wake up several times in between and will also have trouble returning to sleep. Sleep quality is the same when outside home environment.  Patient has noted significant movement of her legs at night that would disrupt her sleep.  The patient  relates no unusual behavior during the night.  The patient relates no history of psychiatric problems. The Epworth Sleepiness Score is 22 out of 24 .  The patient relates  Cardiovascular risk factors include: elevated BP in office.    ROS  General: (-) fever, (-) chills, (-) night sweat Nose and Sinuses: (-) nasal stuffiness or itchiness, (-) postnasal drip, (-) nosebleeds, (-) sinus trouble. Mouth and Throat: (-) sore throat, (-) hoarseness. Neck: (-) swollen glands, (-) enlarged thyroid, (-) neck pain. Respiratory: - cough, - shortness of breath, - wheezing. Neurologic: - numbness, - tingling. Psychiatric: - anxiety, - depression Sleep behavior: -sleep paralysis -hypnogogic hallucinations -dream enactment      -vivid dreams -cataplexy -night terrors -sleep walking   Current Medication: Outpatient Encounter Medications as of 03/28/2023  Medication Sig   Semaglutide-Weight Management (WEGOVY) 1.7 MG/0.75ML SOAJ Inject into the skin.   [DISCONTINUED] gabapentin (NEURONTIN) 300 MG capsule Take 300 mg by mouth 2 (two) times daily.   [DISCONTINUED]  methocarbamol (ROBAXIN) 500 MG tablet Take 1 tablet (500 mg total) by mouth 4 (four) times daily.   [DISCONTINUED] oxyCODONE (OXY IR/ROXICODONE) 5 MG immediate release tablet Take 1 tablet (5 mg total) by mouth every 3 (three) hours as needed for moderate pain ((score 4 to 6)).   No facility-administered encounter medications on file as of 03/28/2023.    Surgical History: Past Surgical History:  Procedure Laterality Date   LUMBAR LAMINECTOMY/DECOMPRESSION MICRODISCECTOMY N/A 10/15/2018   Procedure: LUMBAR LAMINECTOMY/DECOMPRESSION MICRODISCECTOMY 1 LEVEL;  Surgeon: Lucy Chris, MD;  Location: [facility] ORS;  Service: Neurosurgery;  Laterality: N/A;   NO PAST SURGERIES      Medical History: Past Medical History:  Diagnosis Date   Back pain    Medical history non-contributory     Family History: Non contributory to the present illness  Social History: Social History   Socioeconomic History   Marital status: Married    Spouse name: Not on file   Number of children: Not on file   Years of education: Not on file   Highest education level: Not on file  Occupational History   Not on file  Tobacco Use   Smoking status: Former    Current packs/day: 0.50    Types: Cigarettes   Smokeless tobacco: Never  Vaping Use   Vaping status: Never Used  Substance and Sexual Activity   Alcohol use: Yes    Comment: 1 drink per day   Drug use: Never   Sexual activity: Not on file  Other Topics Concern   Not on file  Social History Narrative   Not on file   Social Determinants of Health  Financial Resource Strain: Low Risk  (08/09/2022)   Received from [facility], [facility]   Overall Financial Resource Strain (CARDIA)    Difficulty of Paying Living Expenses: Not hard at all  Food Insecurity: No Food Insecurity (08/09/2022)   Received from [facility], [facility]   Hunger Vital Sign    Worried About Running Out of Food in the Last Year: Never true    Ran Out of Food  in the Last Year: Never true  Transportation Needs: No Transportation Needs (08/09/2022)   Received from [facility], [facility]   [facility] - Transportation    Lack of Transportation (Medical): No    Lack of Transportation (Non-Medical): No  Physical Activity: Sufficiently Active (05/17/2021)   Received from [facility], [facility]   Exercise Vital Sign    Days of Exercise per Week: 5 days    Minutes of Exercise per Session: 30 min  Stress: No Stress Concern Present (08/09/2022)   Received from [facility], [facility] - Occupational Stress Questionnaire    Feeling of Stress : Not at all  Social Connections: Not on file  Intimate Partner Violence: Not At Risk (08/09/2022)   Received from [facility], [facility]   Humiliation, Afraid, Rape, and Kick questionnaire    Fear of Current or Ex-Partner: No    Emotionally Abused: No    Physically Abused: No    Sexually Abused: No    Vital Signs: Blood pressure (!) 151/92, pulse 81, resp. rate 16, height 5\' 7"  (1.702 m), weight (!) 348 lb (157.9 kg), SpO2 96%. Body mass index is 54.5 kg/m.   Examination: General Appearance: The patient is well-developed, well-nourished, and in no distress. Neck Circumference: 39 cm Skin: Gross inspection of skin unremarkable. Head: normocephalic, no gross deformities. Eyes: no gross deformities noted. ENT: ears appear grossly normal Neurologic: Alert and oriented. No involuntary movements.    STOP BANG RISK ASSESSMENT S (snore) Have you been told that you snore?     YES   T (tired) Are you often tired, fatigued, or sleepy during the day?   YES  O (obstruction) Do you stop breathing, choke, or gasp during sleep? YES   P (pressure) Do you have or are you being treated for high blood pressure? NO   B (BMI) Is your body index greater than 35 kg/m? YES   A (age) Are you 45 years old or older? YES   N (neck) Do you have  a neck circumference greater than 16 inches?   NO   G (gender) Are you a female? NO   TOTAL STOP/BANG "YES" ANSWERS 5                                                               A STOP-Bang score of 2 or less is considered low risk, and a score of 5 or more is high risk for having either moderate or severe OSA. For people who score 3 or 4, doctors may need to perform further assessment to determine how likely they are to have OSA.         EPWORTH SLEEPINESS SCALE:  Scale:  (0)= no chance of  dozing; (1)= slight chance of dozing; (2)= moderate chance of dozing; (3)= high chance of dozing  Chance  Situtation    Sitting and reading: 3    Watching TV: 3    Sitting Inactive in public: 3    As a passenger in car: 3      Lying down to rest: 3    Sitting and talking: 2    Sitting quielty after lunch: 3    In a car, stopped in traffic: 2   TOTAL SCORE:   22 out of 24    SLEEP STUDIES:  Split Study (07/2021 at [facility]) AHI 106/hr, min SpO2 87%, CPAP@ 16 cmH2O     LABS: No results found for this or any previous visit (from the past 2160 hour(s)).  Radiology: DG C-Arm 1-60 Min  Result Date: 10/15/2018 CLINICAL DATA:  Intraoperative lumbar spine EXAM: DG C-ARM 61-120 MIN COMPARISON:  None. FINDINGS: A total of 17.3 seconds of fluoroscopic time was utilized. One lateral intraoperative cross-table lateral view centered over the lower lumbar spine and sacrum are provided. No localizing markers are seen. Fine bony detail is limited by the fluoroscopic technique. IMPRESSION: Intraoperative cross-table lateral view of the lower lumbar spine and sacrum. No localizing markers are seen on the single view provided. A total of 17.3 seconds of fluoroscopic time was utilized. Electronically Signed   By: Tollie Eth M.D.   On: 10/15/2018 20:17   DG Lumbar Spine 2-3 Views  Result Date: 10/15/2018 CLINICAL DATA:  Lumbar radiculopathy. Intraoperative films of the lumbar spine. EXAM: LUMBAR  SPINE - 2-3 VIEW COMPARISON:  None. FINDINGS: Localizing intraoperative cross-table lateral view of the lower lumbar spine from L4 through S1 is identified. Fine bony detail is limited with fluoroscopic technique. No localizing markers are identified on images provided. IMPRESSION: Intraoperative cross-table lateral view over the lower lumbar spine and sacrum as above. Electronically Signed   By: Tollie Eth M.D.   On: 10/15/2018 20:16    No results found.  No results found.    Assessment and Plan: Patient Active Problem List   Diagnosis Date Noted   Lumbar radiculopathy 10/15/2018     PLAN OSA:   Patient evaluation suggests high risk of sleep disordered breathing due to hx of OSA, snoring, gasping, choking and apneic pauses, occasional headaches, brain fogginess, trouble concentrating, fatigue, and elevated BMI. Patient has comorbid cardiovascular risk factors including: elevated BP in office which could be exacerbated by pathologic sleep-disordered breathing.  Suggest: PSG to assess/treat the patient's sleep disordered breathing. The patient was also counselled on wt loss to optimize sleep health. Cautioned with driving.  1. Hypersomnia Will order PSG  2. Elevated BP without diagnosis of hypertension Monitor and follow up with PCP  3. Morbid obesity with BMI of 50.0-59.9, adult (HCC) Obesity Counseling: Had a lengthy discussion regarding patients BMI and weight issues. Patient was instructed on portion control as well as increased activity. Also discussed caloric restrictions with trying to maintain intake less than 2000 Kcal. Discussions were made in accordance with the 5As of weight management. Simple actions such as not eating late and if able to, taking a walk is suggested.    General Counseling: I have discussed the findings of the evaluation and examination with Leinani.  I have also discussed any further diagnostic evaluation thatmay be needed or ordered today. Geraldyn  verbalizes understanding of the findings of todays visit. We also reviewed her medications today and discussed drug interactions and side effects including but not  limited excessive drowsiness and altered mental states. We also discussed that there is always a risk not just to her but also people around her. she has been encouraged to call the office with any questions or concerns that should arise related to todays visit.  No orders of the defined types were placed in this encounter.       I have personally obtained a history, evaluated the patient, evaluated pertinent data, formulated the assessment and plan and placed orders.  This patient was seen by Lynn Ito, PA-C in collaboration with Dr. Freda Munro as a part of collaborative care agreement.    Yevonne Pax, MD [facility] Diplomate ABMS Pulmonary and Critical Care Medicine Sleep medicine

## 2023-03-28 ENCOUNTER — Ambulatory Visit: Payer: BC Managed Care – PPO

## 2023-03-28 VITALS — BP 151/92 | HR 81 | Resp 16 | Ht 67.0 in | Wt 348.0 lb

## 2023-03-28 DIAGNOSIS — R030 Elevated blood-pressure reading, without diagnosis of hypertension: Secondary | ICD-10-CM

## 2023-03-28 DIAGNOSIS — G4710 Hypersomnia, unspecified: Secondary | ICD-10-CM

## 2023-03-28 DIAGNOSIS — Z6841 Body Mass Index (BMI) 40.0 and over, adult: Secondary | ICD-10-CM | POA: Diagnosis not present
